# Patient Record
Sex: Female | Born: 1960 | Race: White | Hispanic: No | Smoking: Never smoker
Health system: Southern US, Community
[De-identification: ages and names within clinical notes are randomized; demographics above are authoritative.]

## PROBLEM LIST (undated history)

## (undated) DIAGNOSIS — N390 Urinary tract infection, site not specified: Secondary | ICD-10-CM

## (undated) DIAGNOSIS — T4145XA Adverse effect of unspecified anesthetic, initial encounter: Secondary | ICD-10-CM

## (undated) DIAGNOSIS — N8110 Cystocele, unspecified: Secondary | ICD-10-CM

## (undated) DIAGNOSIS — T8859XA Other complications of anesthesia, initial encounter: Secondary | ICD-10-CM

## (undated) DIAGNOSIS — K219 Gastro-esophageal reflux disease without esophagitis: Secondary | ICD-10-CM

## (undated) DIAGNOSIS — M5430 Sciatica, unspecified side: Secondary | ICD-10-CM

## (undated) DIAGNOSIS — J45909 Unspecified asthma, uncomplicated: Secondary | ICD-10-CM

## (undated) DIAGNOSIS — Z1371 Encounter for nonprocreative screening for genetic disease carrier status: Secondary | ICD-10-CM

## (undated) HISTORY — DX: Unspecified asthma, uncomplicated: J45.909

## (undated) HISTORY — DX: Gastro-esophageal reflux disease without esophagitis: K21.9

---
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
## 1993-01-16 HISTORY — PX: NASAL SINUS SURGERY: SHX719

## 2004-12-21 ENCOUNTER — Ambulatory Visit: Payer: Self-pay | Admitting: Obstetrics and Gynecology

## 2006-08-07 ENCOUNTER — Ambulatory Visit: Payer: Self-pay | Admitting: Obstetrics and Gynecology

## 2007-08-20 ENCOUNTER — Ambulatory Visit: Payer: Self-pay | Admitting: Obstetrics and Gynecology

## 2008-09-24 ENCOUNTER — Ambulatory Visit: Payer: Self-pay | Admitting: Obstetrics and Gynecology

## 2009-10-05 ENCOUNTER — Ambulatory Visit: Payer: Self-pay | Admitting: Obstetrics and Gynecology

## 2011-01-24 ENCOUNTER — Ambulatory Visit: Payer: Self-pay | Admitting: Obstetrics and Gynecology

## 2012-01-01 ENCOUNTER — Ambulatory Visit: Payer: Self-pay | Admitting: Gastroenterology

## 2012-01-25 ENCOUNTER — Ambulatory Visit: Payer: Self-pay | Admitting: Obstetrics and Gynecology

## 2013-06-17 ENCOUNTER — Ambulatory Visit: Payer: Self-pay | Admitting: Obstetrics and Gynecology

## 2013-07-01 ENCOUNTER — Ambulatory Visit: Payer: Self-pay | Admitting: Obstetrics and Gynecology

## 2013-09-22 ENCOUNTER — Inpatient Hospital Stay: Payer: Self-pay | Admitting: Surgery

## 2013-09-22 LAB — URINALYSIS, COMPLETE
BLOOD: NEGATIVE
Bacteria: NONE SEEN
Bilirubin,UR: NEGATIVE
Glucose,UR: NEGATIVE mg/dL (ref 0–75)
KETONE: NEGATIVE
LEUKOCYTE ESTERASE: NEGATIVE
Nitrite: NEGATIVE
PH: 6 (ref 4.5–8.0)
Protein: NEGATIVE
SPECIFIC GRAVITY: 1.009 (ref 1.003–1.030)
Squamous Epithelial: NONE SEEN
WBC UR: NONE SEEN /HPF (ref 0–5)

## 2013-09-22 LAB — CBC WITH DIFFERENTIAL/PLATELET
Basophil #: 0.1 10*3/uL (ref 0.0–0.1)
Basophil %: 0.7 %
Eosinophil #: 0 10*3/uL (ref 0.0–0.7)
Eosinophil %: 0.5 %
HCT: 40.3 % (ref 35.0–47.0)
HGB: 13.8 g/dL (ref 12.0–16.0)
LYMPHS PCT: 22.9 %
Lymphocyte #: 1.9 10*3/uL (ref 1.0–3.6)
MCH: 30.2 pg (ref 26.0–34.0)
MCHC: 34.2 g/dL (ref 32.0–36.0)
MCV: 88 fL (ref 80–100)
MONO ABS: 0.5 x10 3/mm (ref 0.2–0.9)
MONOS PCT: 6.3 %
NEUTROS ABS: 5.8 10*3/uL (ref 1.4–6.5)
NEUTROS PCT: 69.6 %
Platelet: 235 10*3/uL (ref 150–440)
RBC: 4.56 10*6/uL (ref 3.80–5.20)
RDW: 13.4 % (ref 11.5–14.5)
WBC: 8.3 10*3/uL (ref 3.6–11.0)

## 2013-09-22 LAB — COMPREHENSIVE METABOLIC PANEL
ALBUMIN: 4 g/dL (ref 3.4–5.0)
ANION GAP: 8 (ref 7–16)
Alkaline Phosphatase: 134 U/L — ABNORMAL HIGH
BUN: 13 mg/dL (ref 7–18)
Bilirubin,Total: 1.9 mg/dL — ABNORMAL HIGH (ref 0.2–1.0)
CALCIUM: 10.1 mg/dL (ref 8.5–10.1)
CHLORIDE: 105 mmol/L (ref 98–107)
Co2: 26 mmol/L (ref 21–32)
Creatinine: 0.78 mg/dL (ref 0.60–1.30)
EGFR (African American): >60
EGFR (Non-African Amer.): >60
Glucose: 157 mg/dL — ABNORMAL HIGH (ref 65–99)
Osmolality: 281 (ref 275–301)
POTASSIUM: 4.3 mmol/L (ref 3.5–5.1)
SGOT(AST): 468 U/L — ABNORMAL HIGH (ref 15–37)
SGPT (ALT): 307 U/L — ABNORMAL HIGH
Sodium: 139 mmol/L (ref 136–145)
Total Protein: 7.7 g/dL (ref 6.4–8.2)

## 2013-09-22 LAB — LIPASE, BLOOD: Lipase: 231 U/L (ref 73–393)

## 2013-09-22 LAB — TROPONIN I: Troponin-I: <0.02 ng/mL

## 2013-09-23 LAB — CBC WITH DIFFERENTIAL/PLATELET
BASOS ABS: 0 10*3/uL (ref 0.0–0.1)
Basophil %: 0.8 %
EOS PCT: 3.7 %
Eosinophil #: 0.2 10*3/uL (ref 0.0–0.7)
HCT: 38.8 % (ref 35.0–47.0)
HGB: 12.8 g/dL (ref 12.0–16.0)
Lymphocyte #: 1.6 10*3/uL (ref 1.0–3.6)
Lymphocyte %: 34.1 %
MCH: 29.8 pg (ref 26.0–34.0)
MCHC: 33 g/dL (ref 32.0–36.0)
MCV: 91 fL (ref 80–100)
MONOS PCT: 8.6 %
Monocyte #: 0.4 x10 3/mm (ref 0.2–0.9)
NEUTROS ABS: 2.6 10*3/uL (ref 1.4–6.5)
NEUTROS PCT: 52.8 %
Platelet: 208 10*3/uL (ref 150–440)
RBC: 4.29 10*6/uL (ref 3.80–5.20)
RDW: 13.9 % (ref 11.5–14.5)
WBC: 4.8 10*3/uL (ref 3.6–11.0)

## 2013-09-23 LAB — COMPREHENSIVE METABOLIC PANEL
ALK PHOS: 134 U/L — AB
ALT: 256 U/L — AB
AST: 149 U/L — AB (ref 15–37)
Albumin: 3.6 g/dL (ref 3.4–5.0)
Anion Gap: 4 — ABNORMAL LOW (ref 7–16)
BUN: 9 mg/dL (ref 7–18)
Bilirubin,Total: 2 mg/dL — ABNORMAL HIGH (ref 0.2–1.0)
CALCIUM: 9.2 mg/dL (ref 8.5–10.1)
CHLORIDE: 105 mmol/L (ref 98–107)
CREATININE: 0.89 mg/dL (ref 0.60–1.30)
Co2: 31 mmol/L (ref 21–32)
EGFR (Non-African Amer.): >60
Glucose: 104 mg/dL — ABNORMAL HIGH (ref 65–99)
OSMOLALITY: 278 (ref 275–301)
Potassium: 3.9 mmol/L (ref 3.5–5.1)
Sodium: 140 mmol/L (ref 136–145)
Total Protein: 6.8 g/dL (ref 6.4–8.2)

## 2013-09-23 LAB — HCG, QUANTITATIVE, PREGNANCY

## 2013-09-24 LAB — CBC WITH DIFFERENTIAL/PLATELET
BASOS ABS: 0 10*3/uL (ref 0.0–0.1)
Basophil %: 0.1 %
Eosinophil #: 0 10*3/uL (ref 0.0–0.7)
Eosinophil %: 0 %
HCT: 35.7 % (ref 35.0–47.0)
HGB: 12.2 g/dL (ref 12.0–16.0)
LYMPHS ABS: 1.1 10*3/uL (ref 1.0–3.6)
LYMPHS PCT: 10.9 %
MCH: 30.1 pg (ref 26.0–34.0)
MCHC: 34.1 g/dL (ref 32.0–36.0)
MCV: 88 fL (ref 80–100)
MONO ABS: 0.6 x10 3/mm (ref 0.2–0.9)
Monocyte %: 6.5 %
NEUTROS PCT: 82.5 %
Neutrophil #: 8.2 10*3/uL — ABNORMAL HIGH (ref 1.4–6.5)
Platelet: 221 10*3/uL (ref 150–440)
RBC: 4.04 10*6/uL (ref 3.80–5.20)
RDW: 13.7 % (ref 11.5–14.5)
WBC: 10 10*3/uL (ref 3.6–11.0)

## 2013-09-24 LAB — COMPREHENSIVE METABOLIC PANEL
ALBUMIN: 3.6 g/dL (ref 3.4–5.0)
AST: 69 U/L — AB (ref 15–37)
Alkaline Phosphatase: 115 U/L
Anion Gap: 8 (ref 7–16)
BUN: 10 mg/dL (ref 7–18)
Bilirubin,Total: 1.3 mg/dL — ABNORMAL HIGH (ref 0.2–1.0)
CALCIUM: 9.3 mg/dL (ref 8.5–10.1)
CO2: 28 mmol/L (ref 21–32)
Chloride: 100 mmol/L (ref 98–107)
Creatinine: 0.81 mg/dL (ref 0.60–1.30)
Glucose: 141 mg/dL — ABNORMAL HIGH (ref 65–99)
OSMOLALITY: 273 (ref 275–301)
Potassium: 3.9 mmol/L (ref 3.5–5.1)
SGPT (ALT): 179 U/L — ABNORMAL HIGH
Sodium: 136 mmol/L (ref 136–145)
TOTAL PROTEIN: 6.9 g/dL (ref 6.4–8.2)

## 2013-09-26 LAB — PATHOLOGY REPORT

## 2014-01-16 DIAGNOSIS — Z1371 Encounter for nonprocreative screening for genetic disease carrier status: Secondary | ICD-10-CM

## 2014-01-16 HISTORY — DX: Encounter for nonprocreative screening for genetic disease carrier status: Z13.71

## 2014-01-16 HISTORY — PX: CHOLECYSTECTOMY: SHX55

## 2014-01-16 HISTORY — PX: AUGMENTATION MAMMAPLASTY: SUR837

## 2014-05-09 NOTE — H&P (Signed)
   Subjective/Chief Complaint Epigastric pain x 12 hours   History of Present Illness Teresa Mueller is a pleasant, relatively healthy 54 yo F who presents with approx 12 hours of epigastric pain, now resolved, elevated LFT and gallstones.  Pain began acutely approx 4 pm.  Made herself vomit without resolution.  H/o on and off pain past 3 years.  Was fine prior to this.  No sick contacts, no unusual ingestions.  Nl BM yesterday.   Past History H/o right inguinal hernia repair as a child Diabetes - borderline GERD H/o rhinoplasty   Past Med/Surgical Hx:  Diabetes - Borderline:   GERD - Esophageal Reflux:   Hernia Repair:   Rhinoplasty:   ALLERGIES:  No Known Allergies:   Family and Social History:  Family History Diabetes Mellitus  Cancer  Liver, breast cancer   Social History negative tobacco, positive ETOH, Social EtOH   Place of Living Home   Review of Systems:  Subjective/Chief Complaint Epigastric pain   Fever/Chills No   Cough No   Sputum No   Abdominal Pain Yes   Diarrhea No   Constipation No   Nausea/Vomiting No   Chest Pain No   Dysuria No   Tolerating Diet Yes   Physical Exam:  GEN well developed, well nourished, no acute distress   HEENT pink conjunctivae, PERRL, hearing intact to voice, good dentition   RESP normal resp effort  clear BS  no use of accessory muscles   CARD regular rate  no murmur  no thrills  No LE edema  no JVD   ABD denies tenderness  denies Flank Tenderness  no hernia  soft  normal BS  no Abdominal Bruits  no Adominal Mass   SKIN normal to palpation, No rashes, No ulcers, skin turgor good   NEURO cranial nerves intact, negative rigidity, negative tremor, follows commands, strength:, motor/sensory function intact   PSYCH A+O to time, place, person, good insight    Assessment/Admission Diagnosis Teresa Mueller is a pleasant 54 yo F who presents with 12 hours of epigastric pain, elevated LFT, gallstones.  Concern for  choledocholithiasis, possibly has passed stone as no longer in pain.   Plan Will admit for IVF, abx, GI consult.  LFT in am.  Recommend cholecystectomy when duct cleared of stones (via MRCP or improved LFT.   Electronic Signatures: Jarvis NewcomerLundquist, Keatyn Jawad A (MD)  (Signed 07-Sep-15 05:47)  Authored: CHIEF COMPLAINT and HISTORY, PAST MEDICAL/SURGIAL HISTORY, ALLERGIES, FAMILY AND SOCIAL HISTORY, REVIEW OF SYSTEMS, PHYSICAL EXAM, ASSESSMENT AND PLAN   Last Updated: 07-Sep-15 05:47 by Jarvis NewcomerLundquist, Shandrea Lusk A (MD)

## 2014-05-09 NOTE — Discharge Summary (Signed)
PATIENT NAME:  Teresa Mueller, Teresa Mueller MR#:  [id number] DATE OF BIRTH:  Sep 12, 1960  DATE OF ADMISSION:  09/22/2013 DATE OF DISCHARGE:  09/24/2013  DIAGNOSES: Borderline diabetes, reflux disease, choledocholithiasis with cholelithiasis.   PROCEDURE: Laparoscopic cholecystectomy with cholangiography.   HISTORY OF PRESENT ILLNESS: This is a patient with a few hours of abdominal pain in the epigastrium and right upper quadrant and a workup suggesting choledocholithiasis with elevated liver function tests. She was admitted to the hospital, started on IV antibiotics and hydrated. Pain and nausea control was good and her liver function tests began to fall. Her pain resolved. Therefore, she was taken to the operating room with the belief that her gallstone had passed out of the bile duct. This was confirmed on cholangiography with good flow in the duodenum. No intraluminal filling defects. Cholecystectomy was performed laparoscopically and she made an uncomplicated postoperative recovery, to follow up in our office in 10 days, tolerating a regular diet with oral analgesics at home and wound care instructions.    ____________________________ Adah Salvageichard E. Excell Seltzerooper, MD rec:TT D: 09/24/2013 14:46:50 ET T: 09/24/2013 16:54:29 ET JOB#: [id number]  cc: Adah Salvageichard E. Excell Seltzerooper, MD, <Dictator> Lattie HawICHARD E Astrid Vides MD ELECTRONICALLY SIGNED 09/24/2013 18:21

## 2014-05-09 NOTE — Op Note (Signed)
PATIENT NAME:  Teresa Mueller, Teresa Mueller MR#:  [id number] DATE OF BIRTH:  [birth year]  DATE OF PROCEDURE:  09/23/2013  PREOPERATIVE DIAGNOSIS: Choledocholithiasis.   POSTOPERATIVE DIAGNOSIS: Choledocholithiasis.   PROCEDURE: Laparoscopic cholecystectomy with C-arm fluoroscopic cholangiography.   SURGEON: Khylee Algeo E. Excell Seltzerooper, MD   ANESTHESIA: General with endotracheal tube.   ASSISTANT: Larina BrasJen Beard, PA-S   INDICATIONS: This is a patient with signs of choledocholithiasis, with elevated liver function tests and known gallstones. Her pain has resolved, but her LFTs have been increased. We discussed the rationale for offering surgery, the options of observation, risk of bleeding, infection, recurrence of symptoms, failure to resolve her symptoms, open procedure, bile duct damage, bile duct leak, retained common bile duct stone, any of which could require further surgery and/or ERCP, stent and papillotomy. This was all reviewed for her. She understood and agreed to proceed.   FINDINGS: Multiple adhesions of the duodenum to the infundibulum of the gallbladder, signifying prior inflammatory disease. Also, C-arm fluoroscopic cholangiography demonstrated good flow into the duodenum without intraluminal filling defects. The cystic duct had been cannulated, and the proximal ducts were well identified.   DESCRIPTION OF PROCEDURE: The patient was induced to general anesthesia. She was on IV antibiotics. VTE prophylaxis was in place. She was prepped and draped in sterile fashion. Marcaine was infiltrated in the skin and subcutaneous tissues around the periumbilical area, avoiding the prior piercing site. A Veress needle was placed. Pneumoperitoneum was obtained, and a 5 mm trocar port was placed. The abdominal cavity was explored, and under direct vision, a midline 10 mm port was placed in the epigastric area, and 2 lateral 5 mm ports were placed on the right side. The gallbladder was placed on tension. Adhesions were taken  down bluntly without the use of energy. The peritoneum over the infundibulum was incised bluntly. The cystic duct-gallbladder junction was well identified, clipped and incised, and through a separate incision, an Angiocath cholangiogram catheter was placed. C-arm fluoroscopic cholangiography demonstrated the above. The cholangiogram catheter was removed. The cystic duct was doubly clipped and divided, and then the cystic artery was well identified, doubly clipped and divided, and the gallbladder was taken from the gallbladder fossa with electrocautery and passed out through the epigastric port site with the aid of an Endo Catch bag. The area was checked for hemostasis and found to be adequate. There was no sign of bleeding, bile leak or bowel injury. The camera was placed in the epigastric site to view back to the periumbilical site. There was no sign of adhesions or bowel injury. Therefore, pneumoperitoneum was released. All ports were removed. Fascial edges at the epigastric site were approximated with figure-of-eight 0 Vicryls, and 4-0 subcuticular Monocryl was used on all skin edges. Steri-Strips, Mastisol and sterile dressings were placed.   The patient tolerated the procedure well. There were no complications. She was taken to the recovery room in stable condition to be admitted for continued care.    ____________________________ Adah Salvageichard E. Excell Seltzerooper, MD rec:lb D: 09/23/2013 12:07:05 ET T: 09/23/2013 12:37:27 ET JOB#: [id number]  cc: Adah Salvageichard E. Excell Seltzerooper, MD, <Dictator> Lattie HawICHARD E Faron Whitelock MD ELECTRONICALLY SIGNED 09/23/2013 18:31

## 2014-07-16 ENCOUNTER — Ambulatory Visit
Admission: RE | Admit: 2014-07-16 | Discharge: 2014-07-16 | Disposition: A | Discharge status: Home / Self Care | Payer: BC Managed Care – PPO | Source: Ambulatory Visit | Attending: Obstetrics and Gynecology | Admitting: Obstetrics and Gynecology

## 2014-07-16 ENCOUNTER — Other Ambulatory Visit: Payer: Self-pay | Admitting: Obstetrics and Gynecology

## 2014-07-16 DIAGNOSIS — Z1231 Encounter for screening mammogram for malignant neoplasm of breast: Secondary | ICD-10-CM

## 2014-07-22 ENCOUNTER — Other Ambulatory Visit (HOSPITAL_COMMUNITY)
Admission: RE | Admit: 2014-07-22 | Discharge: 2014-07-22 | Disposition: A | Discharge status: Home / Self Care | Payer: BC Managed Care – PPO | Source: Ambulatory Visit | Attending: Plastic Surgery | Admitting: Plastic Surgery

## 2014-07-22 DIAGNOSIS — D509 Iron deficiency anemia, unspecified: Secondary | ICD-10-CM | POA: Diagnosis present

## 2014-07-22 LAB — CBC
HCT: 42 % (ref 36.0–46.0)
HEMOGLOBIN: 13.9 g/dL (ref 12.0–15.0)
MCH: 30 pg (ref 26.0–34.0)
MCHC: 33.1 g/dL (ref 30.0–36.0)
MCV: 90.7 fL (ref 78.0–100.0)
Platelets: 231 10*3/uL (ref 150–400)
RBC: 4.63 MIL/uL (ref 3.87–5.11)
RDW: 13.6 % (ref 11.5–15.5)
WBC: 7.7 10*3/uL (ref 4.0–10.5)

## 2015-08-16 ENCOUNTER — Other Ambulatory Visit: Payer: Self-pay | Admitting: Obstetrics and Gynecology

## 2015-08-16 DIAGNOSIS — Z1231 Encounter for screening mammogram for malignant neoplasm of breast: Secondary | ICD-10-CM

## 2015-08-17 ENCOUNTER — Ambulatory Visit
Admission: RE | Admit: 2015-08-17 | Discharge: 2015-08-17 | Disposition: A | Discharge status: Home / Self Care | Payer: BC Managed Care – PPO | Source: Ambulatory Visit | Attending: Obstetrics and Gynecology | Admitting: Obstetrics and Gynecology

## 2015-08-17 ENCOUNTER — Other Ambulatory Visit: Payer: Self-pay | Admitting: Obstetrics and Gynecology

## 2015-08-17 DIAGNOSIS — Z1231 Encounter for screening mammogram for malignant neoplasm of breast: Secondary | ICD-10-CM | POA: Diagnosis present

## 2015-08-17 HISTORY — DX: Encounter for nonprocreative screening for genetic disease carrier status: Z13.71

## 2015-12-31 IMAGING — MG MM DIGITAL SCREENING BILAT W/ CAD
1 series · 5 of 5 positions shown · non-contrast
Comparison: Previous exam(s).

CLINICAL DATA: Screening.

EXAM:
DIGITAL SCREENING BILATERAL MAMMOGRAM WITH CAD

[R CC · right · 5 of 5 slices shown]
[im 1/5]
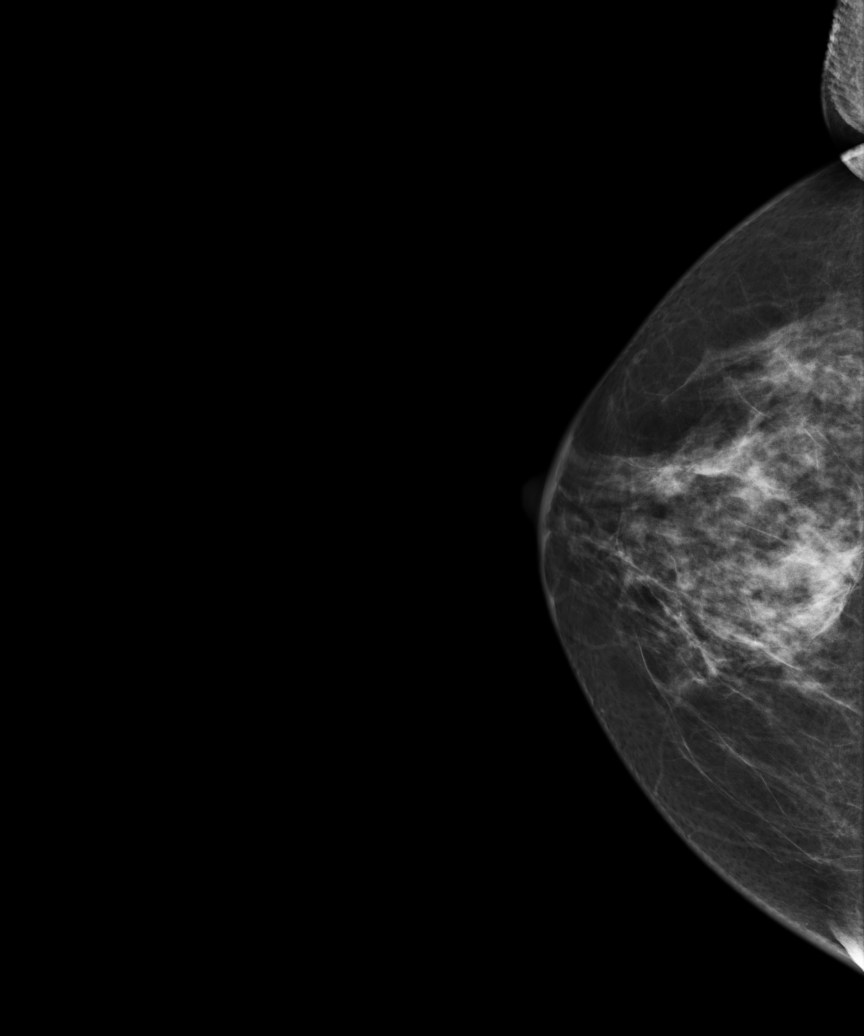
[im 2/5]
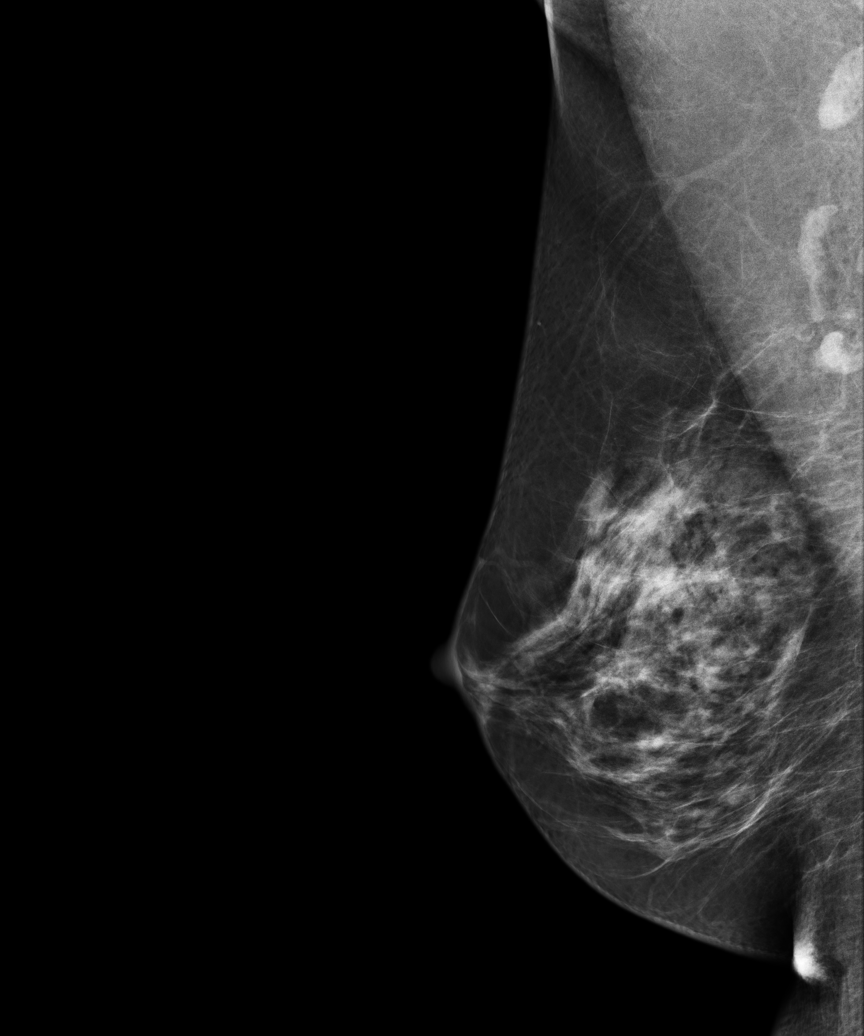
[im 3/5]
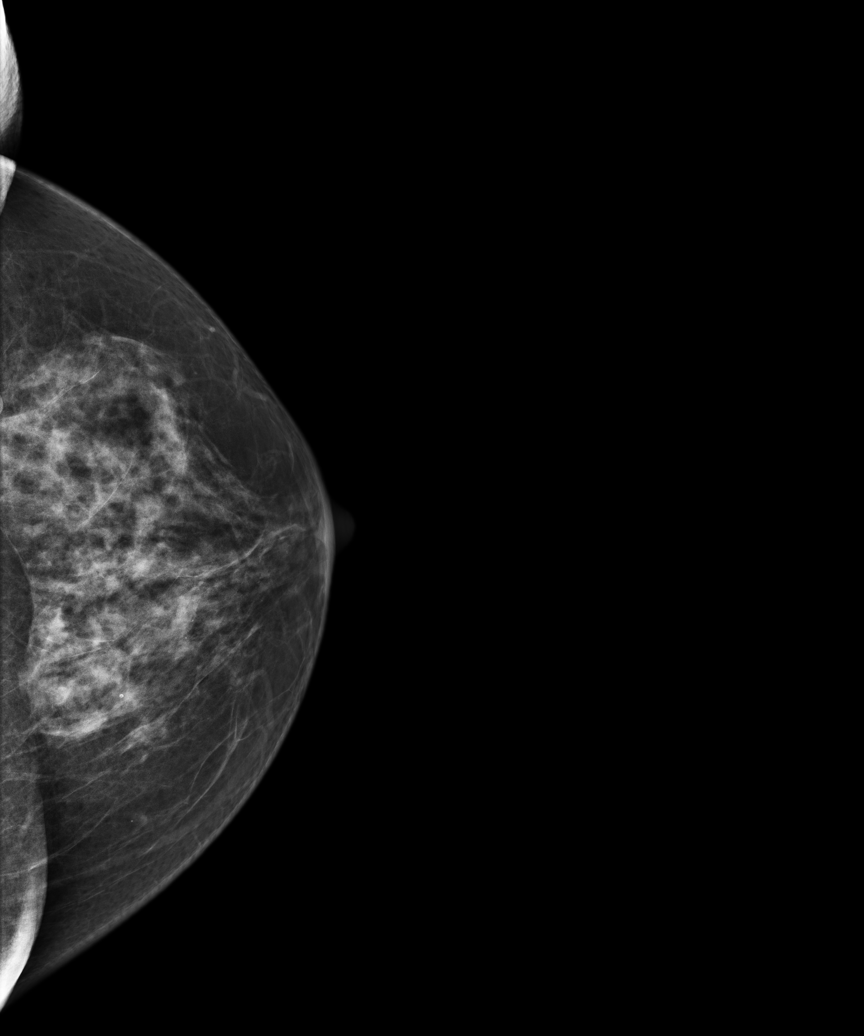
[im 4/5]
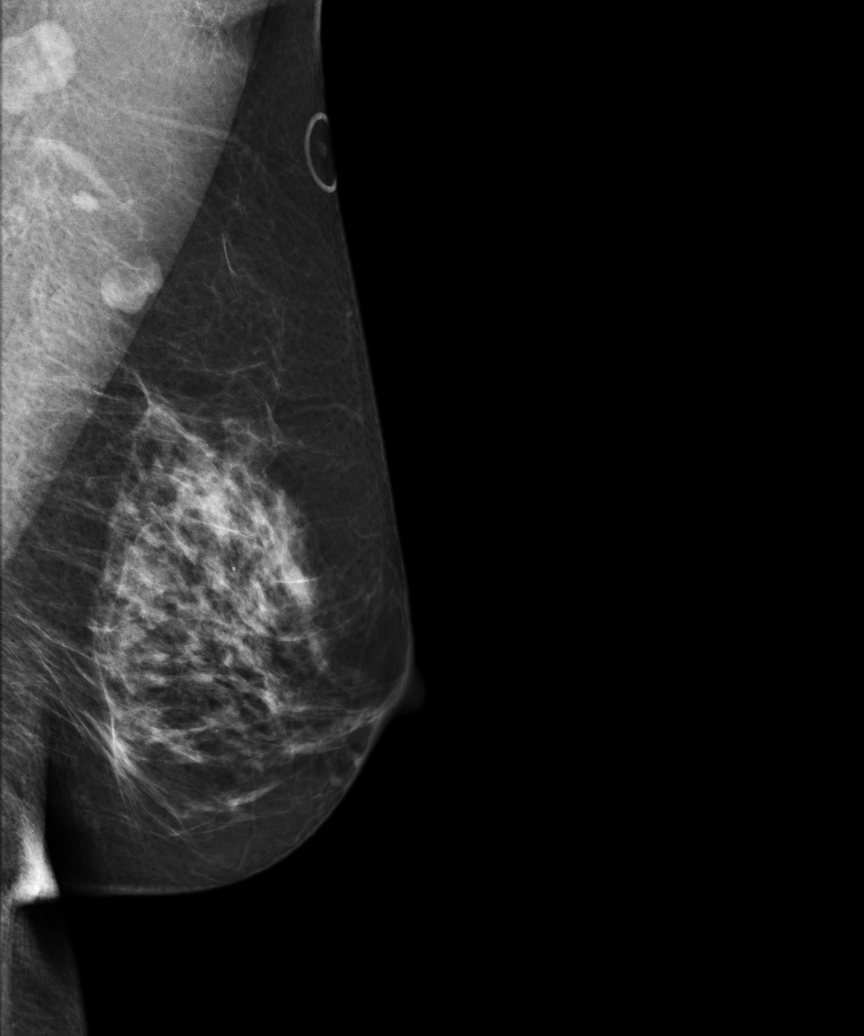
[im 5/5]
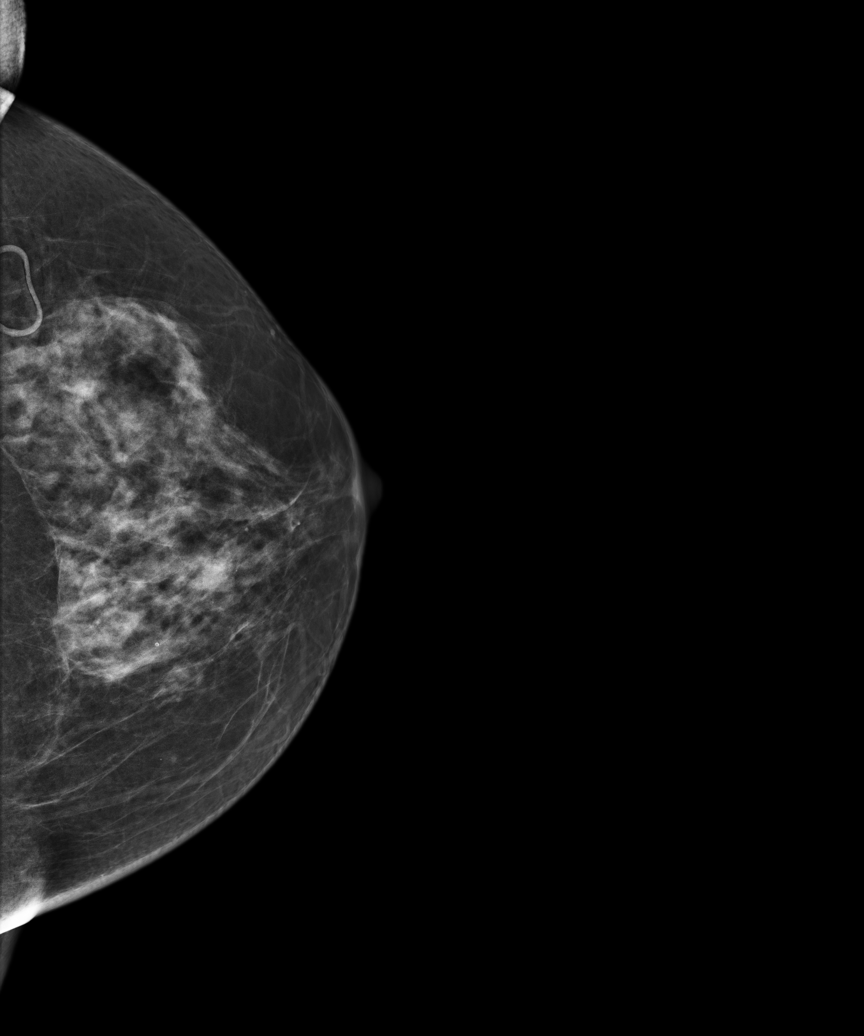

[5 of 5 positions shown; findings below may reference images not displayed]

ACR Breast Density Category c: The breast tissue is heterogeneously
dense, which may obscure small masses.
FINDINGS: There are no findings suspicious for malignancy. Images were
processed with CAD.
IMPRESSION: No mammographic evidence of malignancy. A result letter of this
screening mammogram will be mailed directly to the patient.

RECOMMENDATION:
Screening mammogram in one year. (Code:YJ-[id number])

BI-RADS CATEGORY  1: Negative.

## 2016-02-23 ENCOUNTER — Ambulatory Visit: Payer: Self-pay | Admitting: Urology

## 2016-02-28 ENCOUNTER — Encounter: Payer: Self-pay | Admitting: Urology

## 2016-02-28 ENCOUNTER — Ambulatory Visit: Payer: BC Managed Care – PPO | Admitting: Urology

## 2016-02-28 VITALS — BP 116/80 | HR 97 | Ht 65.0 in | Wt 159.0 lb

## 2016-02-28 DIAGNOSIS — R3129 Other microscopic hematuria: Secondary | ICD-10-CM | POA: Diagnosis not present

## 2016-02-28 DIAGNOSIS — N3941 Urge incontinence: Secondary | ICD-10-CM | POA: Diagnosis not present

## 2016-02-28 DIAGNOSIS — R300 Dysuria: Secondary | ICD-10-CM | POA: Diagnosis not present

## 2016-02-28 DIAGNOSIS — N390 Urinary tract infection, site not specified: Secondary | ICD-10-CM | POA: Diagnosis not present

## 2016-02-28 DIAGNOSIS — J4520 Mild intermittent asthma, uncomplicated: Secondary | ICD-10-CM | POA: Insufficient documentation

## 2016-02-28 DIAGNOSIS — M542 Cervicalgia: Secondary | ICD-10-CM | POA: Insufficient documentation

## 2016-02-28 DIAGNOSIS — K219 Gastro-esophageal reflux disease without esophagitis: Secondary | ICD-10-CM | POA: Insufficient documentation

## 2016-02-28 LAB — BLADDER SCAN AMB NON-IMAGING

## 2016-02-28 NOTE — Progress Notes (Signed)
02/28/2016 4:46 PM   Teresa Mueller 02-15-1960 [id number]  Referring provider: Sherrin Daisy, MD [place], [place] [address]  Chief Complaint  Patient presents with  . Recurrent UTI    New Patient    HPI: Patient is a 56 year old Caucasian female who is referred to [place] by Dr. Kandice Robinsons for recurrent urinary tract infections.  Patient states that she has had urinary tract infections twice over the last year since last fall.    Her symptoms with a urinary tract infection consist of urgency, hands tingling, weak urinary stream and dysuria.    She denies gross hematuria, suprapubic pain, back pain, abdominal pain or flank pain.  She has not had any recent fevers, chills, nausea or vomiting.   She does not have a history of nephrolithiasis, GU surgery or GU trauma.   Reviewing her records,  she has had one documented infection for pan sensitive E.coli in 2016.    She is sexually active.  She has not noted a correlation with her urinary tract infections and sexual intercourse.  She is post menopausal.  She does not engage in anal sex.   She is voiding before and after sex.   She denies constipation and/or diarrhea.  She does engage in good perineal hygiene. She does not take tub baths.   She does have incontinence with urgency.  She does not lose urine when she laughs coughs or sneezes.  She is not using incontinence pads.  She is taking Vesicare 10 mg daily.    She is not having pain with bladder filling.    She has not had any recent imaging studies.    She is drinking two full bottles of water daily.  She has one cup of coffee during the day.  She has been drinking cranberry juice for the last couple of months.  Her PVR was 78 mL.    Patient has had instances of AMH with 0-3 RBC's on a few occasions with negative urine cultures.  PMH: Past Medical History:  Diagnosis Date  . Asthma   . BRCA negative 2016  . Diabetes ([place])   . GERD (gastroesophageal reflux  disease)     Surgical History: Past Surgical History:  Procedure Laterality Date  . AUGMENTATION MAMMAPLASTY Bilateral 2016   SALINE - RETROPECTORAL  . CHOLECYSTECTOMY  2016  . HERNIA REPAIR  1970  . NASAL SINUS SURGERY  1995    Home Medications:  Allergies as of 02/28/2016   No Known Allergies     Medication List       Accurate as of 02/28/16  4:46 PM. Always use your most recent med list.          omeprazole 20 MG capsule Commonly known as:  PRILOSEC TAKE ONE (1) CAPSULE EACH DAY   solifenacin 10 MG tablet Commonly known as:  VESICARE Take by mouth.       Allergies: No Known Allergies  Family History: Family History  Problem Relation Age of Onset  . Breast cancer Mother 60  . Breast cancer Sister 52  . Breast cancer Maternal Aunt   . Bladder Cancer Father     Social History:  reports that she has never smoked. She has never used smokeless tobacco. She reports that she drinks alcohol. She reports that she does not use drugs.  ROS: UROLOGY Frequent Urination?: Yes Hard to postpone urination?: Yes Burning/pain with urination?: Yes Get up at night to urinate?: Yes Leakage of urine?: Yes  Urine stream starts and stops?: Yes Trouble starting stream?: No Do you have to strain to urinate?: No Blood in urine?: No Urinary tract infection?: Yes Sexually transmitted disease?: No Injury to kidneys or bladder?: No Painful intercourse?: No Weak stream?: No Currently pregnant?: No Vaginal bleeding?: No Last menstrual period?: 2016  Gastrointestinal Nausea?: No Vomiting?: No Indigestion/heartburn?: No Diarrhea?: No Constipation?: No  Constitutional Fever: No Night sweats?: No Weight loss?: No Fatigue?: No  Skin Skin rash/lesions?: No Itching?: No  Eyes Blurred vision?: No Double vision?: No  Ears/Nose/Throat Sore throat?: No Sinus problems?: No  Hematologic/Lymphatic Swollen glands?: No Easy bruising?: No  Cardiovascular Leg swelling?:  No Chest pain?: No  Respiratory Cough?: No Shortness of breath?: No  Endocrine Excessive thirst?: No  Musculoskeletal Back pain?: No Joint pain?: Yes  Neurological Headaches?: No Dizziness?: No  Psychologic Depression?: No Anxiety?: No  Physical Exam: BP 116/80   Pulse 97   Ht '5\' 5"'  (1.651 m)   Wt 159 lb (72.1 kg)   BMI 26.46 kg/m   Constitutional: Well nourished. Alert and oriented, No acute distress. HEENT: [place] AT, moist mucus membranes. Trachea midline, no masses. Cardiovascular: No clubbing, cyanosis, or edema. Respiratory: Normal respiratory effort, no increased work of breathing. GI: Abdomen is soft, non tender, non distended, no abdominal masses. Liver and spleen not palpable.  No hernias appreciated.  Stool sample for occult testing is not indicated.   GU: No CVA tenderness.  No bladder fullness or masses.  Normal external genitalia, normal pubic hair distribution, no lesions.  Normal urethral meatus, no lesions, no prolapse, no discharge.   No urethral masses, tenderness and/or tenderness. No bladder fullness, tenderness or masses. Normal vagina mucosa, good estrogen effect, no discharge, no lesions, good pelvic support, Grade III cystocele is noted. No rectocele noted.  No cervical motion tenderness.  Uterus is freely mobile and non-fixed.  No adnexal/parametria masses or tenderness noted.  Anus and perineum are without rashes or lesions.    Skin: No rashes, bruises or suspicious lesions. Lymph: No cervical or inguinal adenopathy. Neurologic: Grossly intact, no focal deficits, moving all 4 extremities. Psychiatric: Normal mood and affect.  Laboratory Data: Lab Results  Component Value Date   WBC 7.7 07/22/2014   HGB 13.9 07/22/2014   HCT 42.0 07/22/2014   MCV 90.7 07/22/2014   PLT 231 07/22/2014    Lab Results  Component Value Date   CREATININE 0.81 09/24/2013     Lab Results  Component Value Date   AST 69 (H) 09/24/2013   Lab Results  Component  Value Date   ALT 179 (H) 09/24/2013     Pertinent Imaging: Results for BRIDGITTE, FELICETTI (MRN [id number]) as of 03/05/2016 21:05  Ref. Range 02/28/2016 15:59  Scan Result Unknown 46m    Assessment & Plan:   1. Microscopic hematuria  - I explained to the patient that there are a number of causes that can be associated with blood in the urine, such as stones, UTI's, damage to the urinary tract and/or cancer.  - At this time, I felt that the patient warranted further urologic evaluation.   The AUA guidelines state that a CT urogram is the preferred imaging study to evaluate hematuria.  - I explained to the patient that a contrast material will be injected into a vein and that in rare instances, an allergic reaction can result and may even life threatening   The patient denies any allergies to contrast, iodine and/or seafood and is not taking metformin.  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 Urge incontinence  - patient currently on Vesicare 10 mg daily  - still experiencing urgency and frequency  - will reassess if CT Urogram does not find an etiology - stone, etc.  3. Dysuria  - occurs with negative cultures  - will reassess if CT Urogram does not find an etiology - stone, etc.   Return for I will call patient with results.  These notes generated with voice recognition software. I apologize for typographical errors.  Zara Council, [facility] [address] [place],  [address] [phone]

## 2016-02-29 LAB — BUN+CREAT
BUN / CREAT RATIO: 27 — AB (ref 9–23)
BUN: 19 mg/dL (ref 6–24)
Creatinine, Ser: 0.7 mg/dL (ref 0.57–1.00)
GFR calc non Af Amer: 97 mL/min/{1.73_m2} (ref >59)
GFR, EST AFRICAN AMERICAN: 112 mL/min/{1.73_m2} (ref >59)

## 2016-03-09 ENCOUNTER — Ambulatory Visit
Admission: RE | Admit: 2016-03-09 | Discharge: 2016-03-09 | Disposition: A | Discharge status: Home / Self Care | Payer: BC Managed Care – PPO | Source: Ambulatory Visit | Attending: Urology | Admitting: Urology

## 2016-03-09 DIAGNOSIS — N8189 Other female genital prolapse: Secondary | ICD-10-CM | POA: Diagnosis not present

## 2016-03-09 DIAGNOSIS — N8110 Cystocele, unspecified: Secondary | ICD-10-CM | POA: Diagnosis not present

## 2016-03-09 DIAGNOSIS — K449 Diaphragmatic hernia without obstruction or gangrene: Secondary | ICD-10-CM | POA: Diagnosis not present

## 2016-03-09 DIAGNOSIS — R3129 Other microscopic hematuria: Secondary | ICD-10-CM | POA: Diagnosis present

## 2016-03-09 DIAGNOSIS — R918 Other nonspecific abnormal finding of lung field: Secondary | ICD-10-CM | POA: Diagnosis not present

## 2016-03-09 MED ORDER — IOPAMIDOL (ISOVUE-300) INJECTION 61%
125.0000 mL | Freq: Once | INTRAVENOUS | Status: AC | PRN
  Administered 2016-03-09: 125 mL via INTRAVENOUS

## 2016-03-09 MED ORDER — IOPAMIDOL (ISOVUE-300) INJECTION 61%: 100.0000 mL | Freq: Once | INTRAVENOUS | Status: DC | PRN

## 2016-03-13 ENCOUNTER — Telehealth: Payer: Self-pay

## 2016-03-13 NOTE — Telephone Encounter (Signed)
-----   Message from Harle BattiestShannon A McGowan, PA-C sent at 03/11/2016  6:29 PM EST ----- Please notify the patient that her CT scan did not find any kidney stones.  I do suggest she undergo a cystoscopy to evaluate the inside of her bladder.

## 2016-03-13 NOTE — Telephone Encounter (Signed)
LMOM

## 2016-03-14 NOTE — Telephone Encounter (Signed)
Spoke with pt in reference to CT results and needing a cysto. Pt voiced understanding. Pt was transferred to the front to make cysto appt.

## 2016-03-14 NOTE — Telephone Encounter (Signed)
LMOM

## 2016-03-21 ENCOUNTER — Ambulatory Visit: Payer: BC Managed Care – PPO | Admitting: Urology

## 2016-03-21 ENCOUNTER — Encounter: Payer: Self-pay | Admitting: Urology

## 2016-03-21 VITALS — BP 114/73 | HR 96 | Ht 65.0 in | Wt 157.0 lb

## 2016-03-21 DIAGNOSIS — R3129 Other microscopic hematuria: Secondary | ICD-10-CM | POA: Diagnosis not present

## 2016-03-21 DIAGNOSIS — N8111 Cystocele, midline: Secondary | ICD-10-CM

## 2016-03-21 DIAGNOSIS — R911 Solitary pulmonary nodule: Secondary | ICD-10-CM

## 2016-03-21 LAB — URINALYSIS, COMPLETE
Bilirubin, UA: NEGATIVE
GLUCOSE, UA: NEGATIVE
KETONES UA: NEGATIVE
Nitrite, UA: NEGATIVE
Protein, UA: NEGATIVE
Urobilinogen, Ur: 0.2 mg/dL (ref 0.2–1.0)
pH, UA: 5 (ref 5.0–7.5)

## 2016-03-21 LAB — MICROSCOPIC EXAMINATION

## 2016-03-21 MED ORDER — CIPROFLOXACIN HCL 500 MG PO TABS
500.0000 mg | ORAL_TABLET | Freq: Once | ORAL | Status: AC
  Administered 2016-03-21: 500 mg via ORAL

## 2016-03-21 MED ORDER — LIDOCAINE HCL 2 % EX GEL
1.0000 "application " | Freq: Once | CUTANEOUS | Status: AC
  Administered 2016-03-21: 1 via URETHRAL

## 2016-03-21 NOTE — Progress Notes (Signed)
   03/23/16  CC:  Chief Complaint  Patient presents with  . Cysto    HPI: 56 yo female with microscopic hematuria who presents today for cystoscopy to complete her microscopic hematuria workup.  She underwent CT urogram on 03/09/2016 which showed evidence of pelvic floor laxity as well as 2 incidental small pulmonary nodules, otherwise unremarkable.  She denies a history of gross hematuria.  She does have fairly significant urinary symptoms including urgency and frequency with occasional sensation of incomplete bladder emptying. She denies any other symptoms of pelvic organ prolapse including no vaginal bulging or pain with intercourse. She has discussed her prolapse with her OB/GYN Dr. Feliberto GottronSchermerhorn it is hesitant to proceed. She is taking Ditropan currently with minimal effect.  She is a never smoker.    Blood pressure 114/73, pulse 96, height 5\' 5"  (1.651 m), weight 157 lb (71.2 kg). NED. A&Ox3.   No respiratory distress   Abd soft, NT, ND Normal external genitalia with patent urethral meatus Notable for stage II cystocele to the vaginal introitus with concomitant apical prolapse appreciated.  Cystoscopy Procedure Note  Patient identification was confirmed, informed consent was obtained, and patient was prepped using Betadine solution.  Lidocaine jelly was administered per urethral meatus.    Preoperative abx where received prior to procedure.    Procedure: - Flexible cystoscope introduced, without any difficulty.   - Thorough search of the bladder revealed:    normal urethral meatus    normal urothelium    no stones    no ulcers     no tumors    no urethral polyps    no trabeculation    Descent of bladder neck and posterior wall of bladder appreciated secondary to cystocele  - Ureteral orifices were normal in position and appearance.  Post-Procedure: - Patient tolerated the procedure well  Assessment/ Plan:  1. Microscopic hematuria S/p complete workup including  CT urogram and cystoscopy, unremarkable - Urinalysis, Complete - ciprofloxacin (CIPRO) tablet 500 mg; Take 1 tablet (500 mg total) by mouth once. - lidocaine (XYLOCAINE) 2 % jelly 1 application; Place 1 application into the urethra once.  2. Midline cystocele Asymptomatic, more bothered by urinary urgency, frequency, and incomplete bladder intake which may be result of cystocele Briefly discussed options including repair of pelvic organ prolapse which may unmask stress urinary incontinence versus pessary Offered referral to Dr. Sherron MondayMacDiarmid declined at this time  3. Pulmonary nodule Incidental small pulmonary nodule 2 and CT urogram Never smoker, low risk therefore no follow-up indicated   No Follow-up on file.    Vanna ScotlandAshley Wynne Rozak, MD

## 2016-09-28 ENCOUNTER — Other Ambulatory Visit: Payer: Self-pay | Admitting: Obstetrics and Gynecology

## 2016-09-28 DIAGNOSIS — Z1231 Encounter for screening mammogram for malignant neoplasm of breast: Secondary | ICD-10-CM

## 2016-10-03 ENCOUNTER — Ambulatory Visit
Admission: RE | Admit: 2016-10-03 | Discharge: 2016-10-03 | Disposition: A | Discharge status: Home / Self Care | Payer: BC Managed Care – PPO | Source: Ambulatory Visit | Attending: Obstetrics and Gynecology | Admitting: Obstetrics and Gynecology

## 2016-10-03 DIAGNOSIS — Z1231 Encounter for screening mammogram for malignant neoplasm of breast: Secondary | ICD-10-CM | POA: Diagnosis not present

## 2016-11-16 DIAGNOSIS — N8110 Cystocele, unspecified: Secondary | ICD-10-CM

## 2016-11-16 HISTORY — DX: Cystocele, unspecified: N81.10

## 2016-11-21 NOTE — H&P (Signed)
Teresa Mueller is a 56 y.o. female here for Follow-up .  Pt is for follow up for pelvic relaxation  With cystocele  And uterine descensus . Main symptoms are significant urgency and nocturia . Does not have urinary retention . Marland Kitchen. No SUI . She is known to have a grade 3 cystocele and uterine descensus both of which are bothersome to her . She is sexually active . Marland Kitchen. No splinting with BM.  Pt was on vesicare then swicthed to mybetriq. ( didn't help )  She now is interested in surgery    Past Medical History:  has a past medical history of Cervicalgia, GERD (gastroesophageal reflux disease), Gestational diabetes, Intermittent asthma, and Sciatica.  Past Surgical History:  has a past surgical history that includes Septoplasty; Hernia repair; Cholecystectomy; and Augmentation mammaplasty. Family History: family history includes Breast cancer in her maternal aunt; Breast cancer (age of onset: 5045) in her sister; Breast cancer (age of onset: 6769) in her mother; Colon cancer (age of onset: 4583) in her mother. Social History:  reports that she has never smoked. She has never used smokeless tobacco. She reports that she drinks alcohol. OB/GYN History:          OB History    Gravida  2   Para  2   Term  2   Preterm      AB      Living  2     SAB      TAB      Ectopic      Molar      Multiple      Live Births  2          Allergies: has No Known Allergies. Medications:  Current Outpatient Medications:  .  omeprazole (PRILOSEC) 20 MG DR capsule, TAKE ONE (1) CAPSULE EACH DAY, Disp: 30 capsule, Rfl: 6 .  solifenacin (VESICARE) 10 MG tablet, Take 1 tablet (10 mg total) by mouth once daily, Disp: 30 tablet, Rfl: 11  Review of Systems: General:                      No fatigue or weight loss Eyes:                           No vision changes Ears:                            No hearing difficulty Respiratory:                No cough or shortness of breath Pulmonary:                   No asthma or shortness of breath Cardiovascular:           No chest pain, palpitations, dyspnea on exertion Gastrointestinal:          No abdominal bloating, chronic diarrhea, constipations, masses, pain or hematochezia Genitourinary:             No hematuria, dysuria, abnormal vaginal discharge, pelvic pain, Menometrorrhagia Lymphatic:                   No swollen lymph nodes Musculoskeletal:         No muscle weakness Neurologic:                  No extremity weakness,  syncope, seizure disorder Psychiatric:                  No history of depression, delusions or suicidal/homicidal ideation    Exam:      Vitals:   11/14/16 1657  BP: 121/79  Pulse: 86    Body mass index is 26.96 kg/m.  WDWN white/  female in NAD   Lungs: CTA  CV : RRR without murmur    Neck:  no thyromegaly Abdomen: soft , no mass, normal active bowel sounds,  non-tender, no rebound tenderness Pelvic: tanner stage 5 ,  External genitalia: vulva /labia no lesions Urethra: no prolapse Vagina: normal physiologic d/c, grade 3 cystocele , no rectocele Cervix: no lesions, no cervical motion tenderness  Uterus: normal size shape and contour, non-tender, second degree uterine descensus Adnexa:no mass, non-tender    Impression:   The primary encounter diagnosis was Dysuria. Diagnoses of Midline cystocele, Descens uteri, and OAB (overactive bladder) were also pertinent to this visit.    Plan:  TVH and BSO   anterior repair  Benefits and risks to surgery: The proposed benefit of the surgery has been discussed with the patient. The possible risks include, but are not limited to: organ injury to the bowel , bladder, ureters, and major blood vessels and nerves. There is a possibility of additional surgeries resulting from these injuries. There is also the risk of blood transfusion and the need to receive blood products during or after the procedure which may rarely lead to HIV or Hepatitis  C infection. There is a risk of developing a deep venous thrombosis or a pulmonary embolism . There is the possibility of wound infection and also anesthetic complications, even the rare possibility of death. The patient understands these risks and wishes to proceed. All questions have been answered and the consent has been signed.         Orders Placed This Encounter  Procedures  . Urine Culture, Routine - [facility]    Standing Status:   Future    Standing Expiration Date:   11/14/2017  . Urinalysis w/Microscopic    Standing Status:   Future    Standing Expiration Date:   11/14/2017      Vilma PraderHOMAS JANSE SCHERMERHORN, MD

## 2016-11-22 ENCOUNTER — Other Ambulatory Visit: Payer: Self-pay

## 2016-11-22 ENCOUNTER — Encounter
Admission: RE | Admit: 2016-11-22 | Discharge: 2016-11-22 | Disposition: A | Discharge status: Home / Self Care | Payer: BC Managed Care – PPO | Source: Ambulatory Visit | Attending: Obstetrics and Gynecology | Admitting: Obstetrics and Gynecology

## 2016-11-22 DIAGNOSIS — Z01812 Encounter for preprocedural laboratory examination: Secondary | ICD-10-CM | POA: Diagnosis present

## 2016-11-22 HISTORY — DX: Sciatica, unspecified side: M54.30

## 2016-11-22 HISTORY — DX: Other complications of anesthesia, initial encounter: T88.59XA

## 2016-11-22 HISTORY — DX: Adverse effect of unspecified anesthetic, initial encounter: T41.45XA

## 2016-11-22 HISTORY — DX: Cystocele, unspecified: N81.10

## 2016-11-22 HISTORY — DX: Urinary tract infection, site not specified: N39.0

## 2016-11-22 LAB — BASIC METABOLIC PANEL
Anion gap: 7 (ref 5–15)
BUN: 12 mg/dL (ref 6–20)
CALCIUM: 9.5 mg/dL (ref 8.9–10.3)
CO2: 28 mmol/L (ref 22–32)
Chloride: 102 mmol/L (ref 101–111)
Creatinine, Ser: 0.68 mg/dL (ref 0.44–1.00)
Glucose, Bld: 127 mg/dL — ABNORMAL HIGH (ref 65–99)
POTASSIUM: 3.9 mmol/L (ref 3.5–5.1)
Sodium: 137 mmol/L (ref 135–145)

## 2016-11-22 LAB — TYPE AND SCREEN
ABO/RH(D): A POS
ANTIBODY SCREEN: NEGATIVE

## 2016-11-22 LAB — CBC
HEMATOCRIT: 40.9 % (ref 35.0–47.0)
HEMOGLOBIN: 13.7 g/dL (ref 12.0–16.0)
MCH: 29.6 pg (ref 26.0–34.0)
MCHC: 33.6 g/dL (ref 32.0–36.0)
MCV: 88.1 fL (ref 80.0–100.0)
Platelets: 240 10*3/uL (ref 150–440)
RBC: 4.64 MIL/uL (ref 3.80–5.20)
RDW: 13.3 % (ref 11.5–14.5)
WBC: 6.1 10*3/uL (ref 3.6–11.0)

## 2016-11-22 MED ORDER — FLEET ENEMA 7-19 GM/118ML RE ENEM
1.0000 | ENEMA | Freq: Once | RECTAL | Status: DC
  Filled 2016-11-22: qty 1

## 2016-11-22 NOTE — Patient Instructions (Signed)
Your procedure is scheduled on: December 01, 2016  Report to [facility], 2ND FLOOR  To find out your arrival time please call [phone] between 1PM - 3PM on Thursday, November 30, 2016  Remember: Instructions that are not followed completely may result in serious medical risk, up to and including death, or upon the discretion of your surgeon and anesthesiologist your surgery may need to be rescheduled.     _X__ 1. Do not eat food after midnight the night before your procedure.                 No gum chewing or hard candies. You may drink clear liquids up to 2 hours                 before you are scheduled to arrive for your surgery- DO not drink clear                 liquids within 2 hours of the start of your surgery.                 Clear Liquids include:  water, apple juice without pulp, clear carbohydrate                 drink such as Clearfast of Gartorade, Black Coffee or Tea (Do not add                 anything to coffee or tea).     _X__ 2.  No Alcohol for 24 hours before or after surgery.   _X__ 3.  Do Not Smoke or use e-cigarettes For 24 Hours Prior to Your Surgery.                 Do not use any chewable tobacco products for at least 6 hours prior to                 surgery.  ____  4.  Bring all medications with you on the day of surgery if instructed.   ____  5.  Notify your doctor if there is any change in your medical condition      (cold, fever, infections).     Do not wear jewelry, make-up, hairpins, clips or nail polish. Do not wear lotions, powders, or perfumes. You may wear deodorant. Do not shave 48 hours prior to surgery. Men may shave face and neck. Do not bring valuables to the hospital.    [facility] not [facility] for any belongings or valuables.  Contacts, dentures or bridgework may not be worn into surgery. Leave your suitcase in the car. After surgery it may be brought to your room. For patients admitted to the  hospital, discharge time is determined by your treatment team.   Patients discharged the day of surgery will not be allowed to drive home.   Please read over the following fact sheets that you were given:   Preparing for surgery    ____ Take these medicines the morning of surgery with A SIP OF WATER:    1. Prilosec, take the night before and the morning of surgery  2.   3.   4.  5.  6.  __x__ Fleet Enema (as directed) USE THE MORNING OF SURGERY  __X__ Use CHG Soap as directed... THE NIGHT BEFORE SURGERY AND THE MORNING OF SURGERY  ____ Use inhalers on the day of surgery  ____ Stop metformin 2 days prior to surgery    ____ Take 1/2  of usual insulin dose the night before surgery. No insulin the morning          of surgery.   __X__ Stop Anti-inflammatories on November 9TH, 2018.  THIS INCLUDES IBUPROFEN/ADVIL AND ALEVE   ____ Stop supplements until after surgery.    ____ Bring C-Pap to the hospital.      CONTINUE YOUR VESICARE UP UNTIL THE DAY BEFORE SURGERY.   PRACTICE INCENTIVE SPIROMETER PRIOR TO SURGERY.

## 2016-11-22 NOTE — Pre-Procedure Instructions (Signed)
Patient provided with an incentive spirometer with instructions for use. She was able to demonstrate proper understanding of its use.  Also, gave patient a Fleets enema for home use on the day of surgery.

## 2016-12-01 ENCOUNTER — Encounter
Admission: RE | Disposition: A | Discharge status: Home / Self Care | Payer: Self-pay | Source: Ambulatory Visit | Attending: Obstetrics and Gynecology

## 2016-12-01 ENCOUNTER — Inpatient Hospital Stay: Payer: BC Managed Care – PPO | Admitting: Anesthesiology

## 2016-12-01 ENCOUNTER — Encounter: Payer: Self-pay | Admitting: *Deleted

## 2016-12-01 ENCOUNTER — Other Ambulatory Visit: Payer: Self-pay

## 2016-12-01 ENCOUNTER — Observation Stay
Admission: RE | Admit: 2016-12-01 | Discharge: 2016-12-02 | Disposition: A | Discharge status: Home / Self Care | Payer: BC Managed Care – PPO | Source: Ambulatory Visit | Attending: Obstetrics and Gynecology | Admitting: Obstetrics and Gynecology

## 2016-12-01 DIAGNOSIS — N838 Other noninflammatory disorders of ovary, fallopian tube and broad ligament: Secondary | ICD-10-CM | POA: Diagnosis not present

## 2016-12-01 DIAGNOSIS — N800 Endometriosis of uterus: Secondary | ICD-10-CM | POA: Insufficient documentation

## 2016-12-01 DIAGNOSIS — N8189 Other female genital prolapse: Secondary | ICD-10-CM | POA: Insufficient documentation

## 2016-12-01 DIAGNOSIS — K219 Gastro-esophageal reflux disease without esophagitis: Secondary | ICD-10-CM | POA: Insufficient documentation

## 2016-12-01 DIAGNOSIS — Z9889 Other specified postprocedural states: Secondary | ICD-10-CM

## 2016-12-01 DIAGNOSIS — N8110 Cystocele, unspecified: Secondary | ICD-10-CM | POA: Diagnosis not present

## 2016-12-01 HISTORY — PX: SALPINGOOPHORECTOMY: SHX82

## 2016-12-01 HISTORY — PX: VAGINAL HYSTERECTOMY: SHX2639

## 2016-12-01 HISTORY — PX: CYSTOCELE REPAIR: SHX163

## 2016-12-01 LAB — ABO/RH: ABO/RH(D): A POS

## 2016-12-01 SURGERY — HYSTERECTOMY, VAGINAL
Anesthesia: General

## 2016-12-01 MED ORDER — LIDOCAINE HCL (CARDIAC) 20 MG/ML IV SOLN
INTRAVENOUS | Status: DC | PRN
  Administered 2016-12-01: 100 mg via INTRAVENOUS

## 2016-12-01 MED ORDER — ONDANSETRON HCL 4 MG PO TABS: 4.0000 mg | ORAL_TABLET | Freq: Four times a day (QID) | ORAL | Status: DC | PRN

## 2016-12-01 MED ORDER — SIMETHICONE 80 MG PO CHEW: 80.0000 mg | CHEWABLE_TABLET | Freq: Four times a day (QID) | ORAL | Status: DC | PRN

## 2016-12-01 MED ORDER — CEFAZOLIN SODIUM-DEXTROSE 2-4 GM/100ML-% IV SOLN
INTRAVENOUS | Status: AC
  Filled 2016-12-01: qty 100

## 2016-12-01 MED ORDER — ONDANSETRON HCL 4 MG/2ML IJ SOLN
INTRAMUSCULAR | Status: AC
  Filled 2016-12-01: qty 2

## 2016-12-01 MED ORDER — DEXAMETHASONE SODIUM PHOSPHATE 10 MG/ML IJ SOLN
INTRAMUSCULAR | Status: DC | PRN
  Administered 2016-12-01: 10 mg via INTRAVENOUS

## 2016-12-01 MED ORDER — MORPHINE SULFATE (PF) 2 MG/ML IV SOLN
1.0000 mg | INTRAVENOUS | Status: DC | PRN
  Administered 2016-12-01: 2 mg via INTRAVENOUS
  Filled 2016-12-01: qty 1

## 2016-12-01 MED ORDER — SCOPOLAMINE 1 MG/3DAYS TD PT72: 1.0000 | MEDICATED_PATCH | TRANSDERMAL | Status: DC

## 2016-12-01 MED ORDER — SULFANILAMIDE 15 % VA CREA
TOPICAL_CREAM | VAGINAL | Status: AC
  Filled 2016-12-01: qty 120

## 2016-12-01 MED ORDER — PROPOFOL 10 MG/ML IV BOLUS
INTRAVENOUS | Status: AC
  Filled 2016-12-01: qty 20

## 2016-12-01 MED ORDER — ONDANSETRON HCL 4 MG/2ML IJ SOLN
4.0000 mg | Freq: Four times a day (QID) | INTRAMUSCULAR | Status: DC | PRN
  Administered 2016-12-01 (×2): 4 mg via INTRAVENOUS
  Filled 2016-12-01 (×2): qty 2

## 2016-12-01 MED ORDER — FENTANYL CITRATE (PF) 250 MCG/5ML IJ SOLN
INTRAMUSCULAR | Status: AC
  Filled 2016-12-01: qty 5

## 2016-12-01 MED ORDER — MIDAZOLAM HCL 2 MG/2ML IJ SOLN
INTRAMUSCULAR | Status: DC | PRN
  Administered 2016-12-01: 2 mg via INTRAVENOUS

## 2016-12-01 MED ORDER — LIDOCAINE HCL (PF) 2 % IJ SOLN
INTRAMUSCULAR | Status: AC
  Filled 2016-12-01: qty 10

## 2016-12-01 MED ORDER — FENTANYL CITRATE (PF) 100 MCG/2ML IJ SOLN
INTRAMUSCULAR | Status: AC
  Filled 2016-12-01: qty 2

## 2016-12-01 MED ORDER — OXYCODONE-ACETAMINOPHEN 5-325 MG PO TABS: 1.0000 | ORAL_TABLET | ORAL | Status: DC | PRN

## 2016-12-01 MED ORDER — DEXAMETHASONE SODIUM PHOSPHATE 10 MG/ML IJ SOLN
INTRAMUSCULAR | Status: AC
  Filled 2016-12-01: qty 1

## 2016-12-01 MED ORDER — SODIUM CHLORIDE 0.9 % IJ SOLN
INTRAMUSCULAR | Status: AC
  Filled 2016-12-01: qty 10

## 2016-12-01 MED ORDER — DIPHENHYDRAMINE HCL 50 MG/ML IJ SOLN
INTRAMUSCULAR | Status: DC | PRN
  Administered 2016-12-01: 6.25 mg via INTRAVENOUS

## 2016-12-01 MED ORDER — ONDANSETRON HCL 4 MG/2ML IJ SOLN
INTRAMUSCULAR | Status: DC | PRN
  Administered 2016-12-01 (×2): 4 mg via INTRAVENOUS

## 2016-12-01 MED ORDER — FENTANYL CITRATE (PF) 100 MCG/2ML IJ SOLN
INTRAMUSCULAR | Status: DC | PRN
  Administered 2016-12-01 (×3): 50 ug via INTRAVENOUS
  Administered 2016-12-01: 100 ug via INTRAVENOUS

## 2016-12-01 MED ORDER — OXYCODONE HCL 5 MG/5ML PO SOLN: 5.0000 mg | Freq: Once | ORAL | Status: DC | PRN

## 2016-12-01 MED ORDER — PHENYLEPHRINE HCL 10 MG/ML IJ SOLN
INTRAMUSCULAR | Status: AC
  Filled 2016-12-01: qty 1

## 2016-12-01 MED ORDER — ACETAMINOPHEN 10 MG/ML IV SOLN
INTRAVENOUS | Status: AC
  Administered 2016-12-01: 1000 mg via INTRAVENOUS
  Filled 2016-12-01: qty 100

## 2016-12-01 MED ORDER — MIDAZOLAM HCL 2 MG/2ML IJ SOLN
INTRAMUSCULAR | Status: AC
  Filled 2016-12-01: qty 2

## 2016-12-01 MED ORDER — PROPOFOL 500 MG/50ML IV EMUL
INTRAVENOUS | Status: AC
  Filled 2016-12-01: qty 50

## 2016-12-01 MED ORDER — PANTOPRAZOLE SODIUM 40 MG PO TBEC
40.0000 mg | DELAYED_RELEASE_TABLET | Freq: Every day | ORAL | Status: DC
  Administered 2016-12-01: 40 mg via ORAL
  Filled 2016-12-01: qty 1

## 2016-12-01 MED ORDER — SEVOFLURANE IN SOLN
RESPIRATORY_TRACT | Status: AC
  Filled 2016-12-01: qty 250

## 2016-12-01 MED ORDER — FENTANYL CITRATE (PF) 100 MCG/2ML IJ SOLN
INTRAMUSCULAR | Status: AC
  Administered 2016-12-01: 25 ug via INTRAVENOUS
  Filled 2016-12-01: qty 2

## 2016-12-01 MED ORDER — LACTATED RINGERS IV SOLN
INTRAVENOUS | Status: DC
  Administered 2016-12-01 – 2016-12-02 (×2): via INTRAVENOUS

## 2016-12-01 MED ORDER — LIDOCAINE-EPINEPHRINE 1 %-1:100000 IJ SOLN
INTRAMUSCULAR | Status: AC
  Filled 2016-12-01: qty 2

## 2016-12-01 MED ORDER — PROPOFOL 500 MG/50ML IV EMUL
INTRAVENOUS | Status: DC | PRN
  Administered 2016-12-01: 100 ug/kg/min via INTRAVENOUS

## 2016-12-01 MED ORDER — SUGAMMADEX SODIUM 200 MG/2ML IV SOLN
INTRAVENOUS | Status: DC | PRN
  Administered 2016-12-01: 144.2 mg via INTRAVENOUS

## 2016-12-01 MED ORDER — SODIUM CHLORIDE FLUSH 0.9 % IV SOLN
INTRAVENOUS | Status: AC
Start: 2016-12-01 — End: 2016-12-01
  Filled 2016-12-01: qty 10

## 2016-12-01 MED ORDER — ROCURONIUM BROMIDE 50 MG/5ML IV SOLN
INTRAVENOUS | Status: AC
  Filled 2016-12-01: qty 1

## 2016-12-01 MED ORDER — PROMETHAZINE HCL 25 MG PO TABS
25.0000 mg | ORAL_TABLET | ORAL | Status: DC | PRN
  Filled 2016-12-01: qty 1

## 2016-12-01 MED ORDER — ESTROGENS, CONJUGATED 0.625 MG/GM VA CREA
TOPICAL_CREAM | VAGINAL | Status: AC
  Filled 2016-12-01: qty 30

## 2016-12-01 MED ORDER — ESTROGENS, CONJUGATED 0.625 MG/GM VA CREA
TOPICAL_CREAM | VAGINAL | Status: DC | PRN
  Administered 2016-12-01: 1 via VAGINAL

## 2016-12-01 MED ORDER — SCOPOLAMINE 1 MG/3DAYS TD PT72
MEDICATED_PATCH | TRANSDERMAL | Status: AC
  Administered 2016-12-01: 1.5 mg
  Filled 2016-12-01: qty 1

## 2016-12-01 MED ORDER — PROMETHAZINE HCL 25 MG/ML IJ SOLN
INTRAMUSCULAR | Status: AC
Start: 2016-12-01 — End: 2016-12-01
  Administered 2016-12-01: 6.25 mg via INTRAVENOUS
  Filled 2016-12-01: qty 1

## 2016-12-01 MED ORDER — KETOROLAC TROMETHAMINE 30 MG/ML IJ SOLN
30.0000 mg | Freq: Three times a day (TID) | INTRAMUSCULAR | Status: DC
  Administered 2016-12-01 – 2016-12-02 (×3): 30 mg via INTRAVENOUS
  Filled 2016-12-01 (×4): qty 1

## 2016-12-01 MED ORDER — LACTATED RINGERS IV SOLN
INTRAVENOUS | Status: DC
  Administered 2016-12-01: 07:00:00 via INTRAVENOUS

## 2016-12-01 MED ORDER — PROPOFOL 10 MG/ML IV BOLUS
INTRAVENOUS | Status: DC | PRN
  Administered 2016-12-01: 150 mg via INTRAVENOUS

## 2016-12-01 MED ORDER — FENTANYL CITRATE (PF) 100 MCG/2ML IJ SOLN
25.0000 ug | INTRAMUSCULAR | Status: DC | PRN
  Administered 2016-12-01 (×2): 25 ug via INTRAVENOUS

## 2016-12-01 MED ORDER — ROCURONIUM BROMIDE 100 MG/10ML IV SOLN
INTRAVENOUS | Status: DC | PRN
  Administered 2016-12-01: 10 mg via INTRAVENOUS
  Administered 2016-12-01 (×2): 50 mg via INTRAVENOUS
  Administered 2016-12-01: 10 mg via INTRAVENOUS

## 2016-12-01 MED ORDER — OXYCODONE HCL 5 MG PO TABS: 5.0000 mg | ORAL_TABLET | Freq: Once | ORAL | Status: DC | PRN

## 2016-12-01 MED ORDER — SUCCINYLCHOLINE CHLORIDE 20 MG/ML IJ SOLN
INTRAMUSCULAR | Status: AC
  Filled 2016-12-01: qty 1

## 2016-12-01 MED ORDER — PROMETHAZINE HCL 25 MG/ML IJ SOLN
6.2500 mg | INTRAMUSCULAR | Status: AC | PRN
  Administered 2016-12-01 (×2): 6.25 mg via INTRAVENOUS

## 2016-12-01 MED ORDER — SODIUM CHLORIDE 0.9 % IJ SOLN
INTRAMUSCULAR | Status: AC
  Administered 2016-12-01: 10 mL
  Filled 2016-12-01: qty 10

## 2016-12-01 MED ORDER — LIDOCAINE-EPINEPHRINE 1 %-1:100000 IJ SOLN
INTRAMUSCULAR | Status: DC | PRN
  Administered 2016-12-01: 10 mL
  Administered 2016-12-01: 2 mL

## 2016-12-01 MED ORDER — KETOROLAC TROMETHAMINE 30 MG/ML IJ SOLN
INTRAMUSCULAR | Status: DC | PRN
  Administered 2016-12-01: 30 mg via INTRAVENOUS

## 2016-12-01 MED ORDER — ACETAMINOPHEN 10 MG/ML IV SOLN
1000.0000 mg | Freq: Once | INTRAVENOUS | Status: AC
  Administered 2016-12-01: 1000 mg via INTRAVENOUS

## 2016-12-01 MED ORDER — PROMETHAZINE HCL 25 MG/ML IJ SOLN
25.0000 mg | INTRAMUSCULAR | Status: DC | PRN
  Administered 2016-12-01: 25 mg via INTRAVENOUS
  Filled 2016-12-01: qty 1

## 2016-12-01 MED ORDER — CEFAZOLIN SODIUM-DEXTROSE 2-4 GM/100ML-% IV SOLN
2.0000 g | Freq: Once | INTRAVENOUS | Status: AC
  Administered 2016-12-01: 2 g via INTRAVENOUS

## 2016-12-01 SURGICAL SUPPLY — 44 items
BAG URO DRAIN 2000ML W/SPOUT (MISCELLANEOUS) ×4 IMPLANT
BLADE SURG 15 STRL LF DISP TIS (BLADE) ×2 IMPLANT
BLADE SURG 15 STRL SS (BLADE) ×2
CANISTER SUCT 1200ML W/VALVE (MISCELLANEOUS) ×4 IMPLANT
CATH FOLEY 2WAY  5CC 16FR (CATHETERS) ×2
CATH ROBINSON RED A/P 16FR (CATHETERS) ×4 IMPLANT
CATH URTH 16FR FL 2W BLN LF (CATHETERS) ×2 IMPLANT
DRAPE PERI LITHO V/GYN (MISCELLANEOUS) ×4 IMPLANT
DRAPE SURG 17X11 SM STRL (DRAPES) ×4 IMPLANT
DRAPE UNDER BUTTOCK W/FLU (DRAPES) ×4 IMPLANT
ELECT REM PT RETURN 9FT ADLT (ELECTROSURGICAL) ×4
ELECTRODE REM PT RTRN 9FT ADLT (ELECTROSURGICAL) ×2 IMPLANT
GAUZE PACK 2X3YD (MISCELLANEOUS) ×4 IMPLANT
GLOVE BIO SURGEON STRL SZ8 (GLOVE) ×4 IMPLANT
GOWN STRL REUS W/ TWL LRG LVL3 (GOWN DISPOSABLE) ×6 IMPLANT
GOWN STRL REUS W/ TWL XL LVL3 (GOWN DISPOSABLE) ×2 IMPLANT
GOWN STRL REUS W/TWL LRG LVL3 (GOWN DISPOSABLE) ×6
GOWN STRL REUS W/TWL XL LVL3 (GOWN DISPOSABLE) ×2
JELLY LUB 2OZ STRL (MISCELLANEOUS) ×2
JELLY LUBE 2OZ STRL (MISCELLANEOUS) ×2 IMPLANT
KIT RM TURNOVER CYSTO AR (KITS) ×4 IMPLANT
KIT RM TURNOVER STRD PROC AR (KITS) ×4 IMPLANT
LABEL OR SOLS (LABEL) ×4 IMPLANT
NDL SAFETY 22GX1.5 (NEEDLE) ×4 IMPLANT
NS IRRIG 500ML POUR BTL (IV SOLUTION) ×4 IMPLANT
PACK BASIN MINOR ARMC (MISCELLANEOUS) ×4 IMPLANT
PAD OB MATERNITY 4.3X12.25 (PERSONAL CARE ITEMS) ×4 IMPLANT
PAD PREP 24X41 OB/GYN DISP (PERSONAL CARE ITEMS) ×4 IMPLANT
SOL PREP PVP 2OZ (MISCELLANEOUS) ×4
SOLUTION PREP PVP 2OZ (MISCELLANEOUS) ×2 IMPLANT
SPONGE XRAY 4X4 16PLY STRL (MISCELLANEOUS) ×4 IMPLANT
SUT PDS 2-0 27IN (SUTURE) ×4 IMPLANT
SUT PDS AB 2-0 CT1 27 (SUTURE) IMPLANT
SUT VIC AB 0 CT1 27 (SUTURE) ×4
SUT VIC AB 0 CT1 27XCR 8 STRN (SUTURE) ×4 IMPLANT
SUT VIC AB 0 CT1 36 (SUTURE) ×8 IMPLANT
SUT VIC AB 2-0 CT1 36 (SUTURE) IMPLANT
SUT VIC AB 2-0 SH 27 (SUTURE) ×2
SUT VIC AB 2-0 SH 27XBRD (SUTURE) ×2 IMPLANT
SUT VIC AB 3-0 SH 27 (SUTURE)
SUT VIC AB 3-0 SH 27X BRD (SUTURE) IMPLANT
SYR CONTROL 10ML (SYRINGE) ×4 IMPLANT
SYRINGE 10CC LL (SYRINGE) ×4 IMPLANT
WATER STERILE IRR 1000ML POUR (IV SOLUTION) IMPLANT

## 2016-12-01 NOTE — Progress Notes (Signed)
Patient ID: [name], female   DOB: 11/03/1960, 56 y.o.   MRN: [id number] DOS TVH / BSO and anterior repair  Nausea today  Better now with phenergan  VSS  Urine output good  VAgina packing removed  A: stable  Voiding trial in am

## 2016-12-01 NOTE — Anesthesia Procedure Notes (Signed)
Procedure Name: Intubation Date/Time: 12/01/2016 7:49 AM Performed by: Deri FuellingPrivette, Josiah Wojtaszek, CRNA Pre-anesthesia Checklist: Patient identified, Emergency Drugs available, Suction available, Patient being monitored and Timeout performed Patient Re-evaluated:Patient Re-evaluated prior to induction Oxygen Delivery Method: Circle system utilized Preoxygenation: Pre-oxygenation with 100% oxygen Induction Type: IV induction Ventilation: Mask ventilation without difficulty Laryngoscope Size: McGraph and 3 Grade View: Grade II Tube type: Oral Tube size: 6.5 mm Number of attempts: 1 Airway Equipment and Method: Stylet Placement Confirmation: ETT inserted through vocal cords under direct vision,  positive ETCO2,  CO2 detector and breath sounds checked- equal and bilateral Secured at: 20 cm Tube secured with: Tape Dental Injury: Teeth and Oropharynx as per pre-operative assessment

## 2016-12-01 NOTE — Anesthesia Post-op Follow-up Note (Signed)
Anesthesia QCDR form completed.        

## 2016-12-01 NOTE — OR Nursing (Signed)
Patient had an episode of vomiting phenergan given med given for pain

## 2016-12-01 NOTE — Transfer of Care (Signed)
Immediate Anesthesia Transfer of Care Note  Patient: Teresa MassonMargaret G Mueller  Procedure(s) Performed: HYSTERECTOMY VAGINAL (N/A ) SALPINGO OOPHORECTOMY (Bilateral ) ANTERIOR REPAIR (CYSTOCELE) (N/A )  Patient Location: PACU  Anesthesia Type:General  Level of Consciousness: alert   Airway & Oxygen Therapy: Patient Spontanous Breathing  Post-op Assessment: Report given to RN  Post vital signs: Reviewed  Last Vitals:  Vitals:   12/01/16 0616  BP: (!) 115/55  Pulse: 90  Resp: 16  Temp: (!) 36.3 C  SpO2: 98%    Last Pain:  Vitals:   12/01/16 0616  TempSrc: Oral         Complications: No apparent anesthesia complications

## 2016-12-01 NOTE — Anesthesia Preprocedure Evaluation (Signed)
Anesthesia Evaluation  Patient identified by MRN, date of birth, ID band Patient awake    Reviewed: Allergy & Precautions, H&P , NPO status , Patient's Chart, lab work & pertinent test results  History of Anesthesia Complications (+) history of anesthetic complications  Airway Mallampati: III  TM Distance: <3 FB Neck ROM: full    Dental  (+) Chipped   Pulmonary neg shortness of breath, asthma ,           Cardiovascular Exercise Tolerance: Good (-) angina(-) Past MI and (-) DOE negative cardio ROS       Neuro/Psych  Neuromuscular disease negative psych ROS   GI/Hepatic Neg liver ROS, GERD  Medicated and Controlled,  Endo/Other  negative endocrine ROS  Renal/GU      Musculoskeletal   Abdominal   Peds  Hematology negative hematology ROS (+)   Anesthesia Other Findings Past Medical History: No date: Asthma     Comment:  patient denies asthma, does not use inhalers 2016: BRCA negative No date: Complication of anesthesia     Comment:  severe nausea and vomitting after general anesthesia 11/2016: Cystocele without uterine prolapse No date: GERD (gastroesophageal reflux disease) No date: Sciatica No date: UTI (urinary tract infection)     Comment:  frequent  Past Surgical History: 2016: AUGMENTATION MAMMAPLASTY; Bilateral     Comment:  SALINE - RETROPECTORAL 2016: CHOLECYSTECTOMY 1970: HERNIA REPAIR     Comment:  inguinal 1995: NASAL SINUS SURGERY  BMI    Body Mass Index:  26.46 kg/m      Reproductive/Obstetrics negative OB ROS                             Anesthesia Physical Anesthesia Plan  ASA: III  Anesthesia Plan: General ETT   Post-op Pain Management:    Induction: Intravenous  PONV Risk Score and Plan: 4 or greater and Ondansetron, Propofol infusion, Midazolam, Dexamethasone and Scopolamine patch - Pre-op  Airway Management Planned: Oral ETT  Additional  Equipment:   Intra-op Plan:   Post-operative Plan: Extubation in OR  Informed Consent: I have reviewed the patients History and Physical, chart, labs and discussed the procedure including the risks, benefits and alternatives for the proposed anesthesia with the patient or authorized representative who has indicated his/her understanding and acceptance.   Dental Advisory Given  Plan Discussed with: Anesthesiologist, CRNA and Surgeon  Anesthesia Plan Comments: (Patient consented for risks of anesthesia including but not limited to:  - adverse reactions to medications - damage to teeth, lips or other oral mucosa - sore throat or hoarseness - Damage to heart, brain, lungs or loss of life  Patient voiced understanding.)        Anesthesia Quick Evaluation

## 2016-12-01 NOTE — Progress Notes (Signed)
[facility] . Labs reviewed . NPO  Ready to proceed

## 2016-12-01 NOTE — Brief Op Note (Signed)
12/01/2016  9:36 AM  PATIENT:  Lars MassonMargaret G Hanko  56 y.o. female  PRE-OPERATIVE DIAGNOSIS:  Pelvic Relaxation  POST-OPERATIVE DIAGNOSIS:  Pelvic Relaxation  PROCEDURE:  Procedure(s): HYSTERECTOMY VAGINAL (N/A) SALPINGO OOPHORECTOMY (Bilateral) ANTERIOR REPAIR (CYSTOCELE) (N/A)  SURGEON:  Surgeon(s) and Role:    * Franshesca Chipman, Ihor Austinhomas J, MD - Primary  PHYSICIAN ASSISTANT: Dalbert GarnetBeasley MD  ASSISTANTS: scrub tech   ANESTHESIA:   general  EBL:  100 mL   BLOOD ADMINISTERED:none  DRAINS: Urinary Catheter (Foley)   LOCAL MEDICATIONS USED:  LIDOCAINE   SPECIMEN:  Source of Specimen:  cervix , uterus , bilateral tubes and ovaries  DISPOSITION OF SPECIMEN:  PATHOLOGY  COUNTS:  YES  TOURNIQUET:  * No tourniquets in log *  DICTATION: .verbal PLAN OF CARE: Admit for overnight observation  PATIENT DISPOSITION:  PACU - hemodynamically stable.   Delay start of Pharmacological VTE agent (>24hrs) due to surgical blood loss or risk of bleeding: not applicable

## 2016-12-01 NOTE — Anesthesia Postprocedure Evaluation (Signed)
Anesthesia Post Note  Patient: Lars MassonMargaret G Klemz  Procedure(s) Performed: HYSTERECTOMY VAGINAL (N/A ) SALPINGO OOPHORECTOMY (Bilateral ) ANTERIOR REPAIR (CYSTOCELE) (N/A )  Patient location during evaluation: PACU Anesthesia Type: General Level of consciousness: awake and alert Pain management: pain level controlled Vital Signs Assessment: post-procedure vital signs reviewed and stable Respiratory status: spontaneous breathing, nonlabored ventilation, respiratory function stable and patient connected to nasal cannula oxygen Cardiovascular status: blood pressure returned to baseline and stable Postop Assessment: no apparent nausea or vomiting Anesthetic complications: no Comments: Patient has PONV with both volatile anesthetics and narcotics as she has PONV with a TIVA     Last Vitals:  Vitals:   12/01/16 1152 12/01/16 1303  BP: 115/82 135/70  Pulse: (!) 105 100  Resp: 20 18  Temp: 36.8 C 36.9 C  SpO2: 98% 98%    Last Pain:  Vitals:   12/01/16 1307  TempSrc:   PainSc: Asleep                 Cleda MccreedyJoseph K Areli Jowett

## 2016-12-02 ENCOUNTER — Encounter: Payer: Self-pay | Admitting: Obstetrics and Gynecology

## 2016-12-02 DIAGNOSIS — N8110 Cystocele, unspecified: Secondary | ICD-10-CM | POA: Diagnosis not present

## 2016-12-02 LAB — CBC
HCT: 39.2 % (ref 35.0–47.0)
Hemoglobin: 13.1 g/dL (ref 12.0–16.0)
MCH: 29.5 pg (ref 26.0–34.0)
MCHC: 33.5 g/dL (ref 32.0–36.0)
MCV: 88 fL (ref 80.0–100.0)
Platelets: 235 10*3/uL (ref 150–440)
RBC: 4.45 MIL/uL (ref 3.80–5.20)
RDW: 13.3 % (ref 11.5–14.5)
WBC: 12.4 10*3/uL — ABNORMAL HIGH (ref 3.6–11.0)

## 2016-12-02 LAB — BASIC METABOLIC PANEL
Anion gap: 9 (ref 5–15)
BUN: 15 mg/dL (ref 6–20)
CO2: 27 mmol/L (ref 22–32)
CREATININE: 0.83 mg/dL (ref 0.44–1.00)
Calcium: 9.7 mg/dL (ref 8.9–10.3)
Chloride: 99 mmol/L — ABNORMAL LOW (ref 101–111)
GFR calc Af Amer: >60 mL/min (ref >60)
GFR calc non Af Amer: >60 mL/min (ref >60)
GLUCOSE: 139 mg/dL — AB (ref 65–99)
POTASSIUM: 4.3 mmol/L (ref 3.5–5.1)
SODIUM: 135 mmol/L (ref 135–145)

## 2016-12-02 MED ORDER — OXYCODONE-ACETAMINOPHEN 5-325 MG PO TABS
1.0000 | ORAL_TABLET | ORAL | 0 refills | Status: AC | PRN
Start: 2016-12-02 — End: ?

## 2016-12-02 MED ORDER — IBUPROFEN 800 MG PO TABS: 800.0000 mg | ORAL_TABLET | Freq: Three times a day (TID) | ORAL | 0 refills | Status: AC | PRN

## 2016-12-02 MED ORDER — SIMETHICONE 80 MG PO CHEW: 80.0000 mg | CHEWABLE_TABLET | Freq: Four times a day (QID) | ORAL | 0 refills | Status: AC | PRN

## 2016-12-02 MED ORDER — ONDANSETRON HCL 4 MG PO TABS: 4.0000 mg | ORAL_TABLET | Freq: Four times a day (QID) | ORAL | 0 refills | Status: AC | PRN

## 2016-12-02 NOTE — Progress Notes (Signed)
D/C instructions provided, pt states understanding, aware of follow up appt. Prescription given to pt.  D/C home to car via in wheelchair.

## 2016-12-02 NOTE — Discharge Summary (Signed)
Physician Discharge Summary  Patient ID: Teresa MassonMargaret G Mueller MRN: [id number] DOB/AGE: 57/03/1960 57 y.o.  Admit date: 12/01/2016 Discharge date: 12/02/2016  Admission Diagnoses: pelvic relaxation   Discharge Diagnoses:  Active Problems:   Postoperative state   Discharged Condition: good  Hospital Course: uncomplicated TVH and BSO  And anterior colporrhaphy  Consults: None  Significant Diagnostic Studies: labs:  Results for orders placed or performed during the hospital encounter of 12/01/16 (from the past 24 hour(s))  CBC     Status: Abnormal   Collection Time: 12/02/16  6:39 AM  Result Value Ref Range   WBC 12.4 (H) 3.6 - 11.0 K/uL   RBC 4.45 3.80 - 5.20 MIL/uL   Hemoglobin 13.1 12.0 - 16.0 g/dL   HCT 40.939.2 81.135.0 - 91.447.0 %   MCV 88.0 80.0 - 100.0 fL   MCH 29.5 26.0 - 34.0 pg   MCHC 33.5 32.0 - 36.0 g/dL   RDW 78.213.3 95.611.5 - 21.314.5 %   Platelets 235 150 - 440 K/uL  Basic metabolic panel     Status: Abnormal   Collection Time: 12/02/16  6:39 AM  Result Value Ref Range   Sodium 135 135 - 145 mmol/L   Potassium 4.3 3.5 - 5.1 mmol/L   Chloride 99 (L) 101 - 111 mmol/L   CO2 27 22 - 32 mmol/L   Glucose, Bld 139 (H) 65 - 99 mg/dL   BUN 15 6 - 20 mg/dL   Creatinine, Ser 0.860.83 0.44 - 1.00 mg/dL   Calcium 9.7 8.9 - 57.810.3 mg/dL   GFR calc non Af Amer >60 >60 mL/min   GFR calc Af Amer >60 >60 mL/min   Anion gap 9 5 - 15      Treatments: surgery: as above  Pt passed voiding trial on day of discharge with 40 cc residual urine   Discharge Exam: Blood pressure 116/72, pulse 86, temperature 97.9 F (36.6 C), temperature source Oral, resp. rate 18, height 5\' 5"  (1.651 m), weight 72.1 kg (159 lb), SpO2 100 %. General appearance: alert and cooperative Abd ; soft  NT   Disposition:   Discharge Instructions    Call MD for:   Complete by:  As directed    Urinary retention   Call MD for:  difficulty breathing, headache or visual disturbances   Complete by:  As directed    Call MD  for:  extreme fatigue   Complete by:  As directed    Call MD for:  hives   Complete by:  As directed    Call MD for:  persistant dizziness or light-headedness   Complete by:  As directed    Call MD for:  persistant nausea and vomiting   Complete by:  As directed    Call MD for:  redness, tenderness, or signs of infection (pain, swelling, redness, odor or green/yellow discharge around incision site)   Complete by:  As directed    Call MD for:  severe uncontrolled pain   Complete by:  As directed    Call MD for:  temperature >100.4   Complete by:  As directed    Diet - low sodium heart healthy   Complete by:  As directed    Increase activity slowly   Complete by:  As directed      Allergies as of 12/02/2016   No Active Allergies     Medication List    TAKE these medications   ibuprofen 800 MG tablet Commonly known as:  ADVIL,MOTRIN Take  1 tablet (800 mg total) every 8 (eight) hours as needed by mouth for moderate pain. What changed:    medication strength  how much to take  when to take this  reasons to take this   omeprazole 20 MG capsule Commonly known as:  PRILOSEC Take 20 mgs by mouth every evening   ondansetron 4 MG tablet Commonly known as:  ZOFRAN Take 1 tablet (4 mg total) every 6 (six) hours as needed by mouth for nausea.   oxyCODONE-acetaminophen 5-325 MG tablet Commonly known as:  PERCOCET/ROXICET Take 1-2 tablets every 4 (four) hours as needed by mouth (moderate to severe pain (when tolerating fluids)).   simethicone 80 MG chewable tablet Commonly known as:  MYLICON Chew 1 tablet (80 mg total) 4 (four) times daily as needed by mouth for flatulence.   sodium chloride 0.65 % Soln nasal spray Commonly known as:  OCEAN Place 1 spray into both nostrils as needed for congestion.   solifenacin 10 MG tablet Commonly known as:  VESICARE Take 10 mg by mouth every evening.   VISINE OP Apply 1 drop to eye daily as needed (dry eyes).      Follow-up  Information    Arryanna Holquin, Ihor Austinhomas J, MD Follow up in 2 week(s).   Specialty:  Obstetrics and Gynecology Why:  post op check  Contact information: [address] [facility] [place] [id number] [phone]           Signed: Ihor Austinhomas J Jaycelyn Orrison 12/02/2016, 10:30 AM

## 2016-12-04 LAB — SURGICAL PATHOLOGY

## 2016-12-04 NOTE — Op Note (Signed)
NAME:  Teresa Mueller, [name]              ACCOUNT NO.:  [id number]  MEDICAL RECORD NO.:  [id number]  LOCATION:                                 FACILITY:  PHYSICIAN:  Jennell Cornerhomas Schermerhorn, MD     DATE OF BIRTH:  DATE OF PROCEDURE:  12/01/2016 DATE OF DISCHARGE:                              OPERATIVE REPORT   PREOPERATIVE DIAGNOSIS:  Pelvic relaxation, second-degree uterine descensus, and second and third-degree cystocele.  POSTOPERATIVE DIAGNOSIS:  Pelvic relaxation, second-degree uterine descensus, and second and third-degree cystocele.  PROCEDURE PERFORMED: 1. Total vaginal hysterectomy. 2. Bilateral salpingo-oophorectomy. 3. Anterior colporrhaphy.  SURGEON:  Jennell Cornerhomas Schermerhorn, MD  ANESTHESIA:  General endotracheal anesthesia.  FIRST ASSISTANT:  Dalbert GarnetBeasley.  INDICATIONS:  A 56 year old gravida 2, para 2 patient with second-degree uterine descensus and third-degree cystocele.  The patient is symptomatic and wishes to have surgical repair.  DESCRIPTION OF PROCEDURE:  After adequate general endotracheal anesthesia, the patient was placed in dorsal supine position.  Legs were placed in the candy-cane stirrups.  Lower abdomen, perineum, and vagina were prepped and draped in normal sterile fashion.  The patient did receive 2 g IV Ancef prior to commencement of the case.  Time-out was performed.  Straight catheterization of the bladder yielded 200 mL of clear urine.  A weighted speculum was then placed into the vagina and the cervix was grasped with 2 thyroid tenacula.  Cervix was circumferentially injected with 1% lidocaine with 1:100,000 epinephrine. A direct posterior colpotomy incision was performed, and once gaining access into the posterior cul-de-sac, uterosacral ligaments were bilaterally clamped, transected, suture ligated with 0 Vicryl suture. The anterior cervix was then incised with the Bovie.  Cardinal ligaments were then bilaterally clamped, transected, suture ligated  with 0 Vicryl suture.  The anterior cul-de-sac was entered sharply and a Deaver retractor was placed within to elevate the bladder anteriorly.  Uterine arteries were then bilaterally clamped, transected, suture ligated with 0 Vicryl suture.  Sequential bites in the broad ligament followed ultimately the cornua were bilaterally clamped, transected, and sutured with 0 Vicryl suture and tagged for later identification.  The right fallopian tube and ovary were clamped and were transected.  Pedicle was doubly ligated with 0 Vicryl suture.  Similar procedure was repeated on the patient's left side.  Again, the fallopian tube and ovary were clamped, transected, and doubly ligated with 0 Vicryl suture.  Good hemostasis was noted.  The peritoneum was then closed with a pursestring 2-0 PDS suture.  Attention was then directed to the anterior vagina where the cystocele was.  The anterior vagina was placed on traction centrally and injected with 1% lidocaine with epinephrine.  Vaginal epithelium was opened centrally with Metzenbaum scissors.  The vaginal epithelium was dissected free from the bladder with sharp and blunt dissection.  Dissection was performed through approximately 1.5 cm from the urethral meatus.  Healthy endopelvic fascia was identified and defect was closed with 2-0 Vicryl suture placed in horizontal mattress sutures with a good reduction of the cystocele.  Vaginal epithelium was then trimmed and the vaginal cuff was then closed in its entirety with 0 Vicryl suture.  Uterosacral ligaments were plicated centrally, and the rest of vaginal  cuff was closed.  Small amount of oozing was noted from the cystocele site, therefore the vagina was packed with a Kerlix roll with Premarin cream.  Foley was placed yielding additional 100 mL of urine.  There were no complications.  ESTIMATED BLOOD LOSS:  100 mL.  INTRAOPERATIVE FLUIDS:  1500 mL.  URINE OUTPUT:  300 mL.  The patient was taken  to recovery room in good condition.    ______________________________ Jennell Cornerhomas Schermerhorn, MD   ______________________________ Jennell Cornerhomas Schermerhorn, MD    TS/MEDQ  D:  12/01/2016  T:  12/01/2016  Job:  [id number]

## 2017-12-25 ENCOUNTER — Other Ambulatory Visit: Payer: Self-pay | Admitting: Obstetrics and Gynecology

## 2017-12-25 DIAGNOSIS — Z1231 Encounter for screening mammogram for malignant neoplasm of breast: Secondary | ICD-10-CM

## 2018-01-10 ENCOUNTER — Ambulatory Visit
Admission: RE | Admit: 2018-01-10 | Discharge: 2018-01-10 | Disposition: A | Discharge status: Home / Self Care | Payer: BC Managed Care – PPO | Source: Ambulatory Visit | Attending: Obstetrics and Gynecology | Admitting: Obstetrics and Gynecology

## 2018-01-10 DIAGNOSIS — Z1231 Encounter for screening mammogram for malignant neoplasm of breast: Secondary | ICD-10-CM | POA: Diagnosis present

## 2019-01-23 ENCOUNTER — Other Ambulatory Visit: Payer: Self-pay | Admitting: Obstetrics and Gynecology

## 2019-01-23 DIAGNOSIS — Z1231 Encounter for screening mammogram for malignant neoplasm of breast: Secondary | ICD-10-CM

## 2019-02-03 ENCOUNTER — Ambulatory Visit
Admission: RE | Admit: 2019-02-03 | Discharge: 2019-02-03 | Disposition: A | Discharge status: Home / Self Care | Payer: BC Managed Care – PPO | Source: Ambulatory Visit | Attending: Obstetrics and Gynecology | Admitting: Obstetrics and Gynecology

## 2019-02-03 DIAGNOSIS — Z1231 Encounter for screening mammogram for malignant neoplasm of breast: Secondary | ICD-10-CM | POA: Diagnosis not present

## 2019-04-10 ENCOUNTER — Ambulatory Visit: Payer: BC Managed Care – PPO | Attending: Internal Medicine

## 2019-04-10 DIAGNOSIS — Z23 Encounter for immunization: Secondary | ICD-10-CM

## 2019-04-10 NOTE — Progress Notes (Signed)
   Covid-19 Vaccination Clinic  Name:  NAKAIYA BEDDOW    MRN: [id number] DOB: [birth year]  04/10/2019  Ms. Newland was observed post Covid-19 immunization for 15 minutes without incident. She was provided with Vaccine Information Sheet and instruction to access the V-Safe system.   Ms. Vidaurri was instructed to call 911 with any severe reactions post vaccine: Marland Kitchen Difficulty breathing  . Swelling of face and throat  . A fast heartbeat  . A bad rash all over body  . Dizziness and weakness   Immunizations Administered    Name Date Dose VIS Date Route   Pfizer COVID-19 Vaccine 04/10/2019  9:30 AM 0.3 mL 12/27/2018 Intramuscular   Manufacturer: [employer], [employer]   Lot: [id number]   NDC: [address]-9

## 2020-03-16 ENCOUNTER — Other Ambulatory Visit: Payer: Self-pay | Admitting: Obstetrics and Gynecology

## 2020-03-16 DIAGNOSIS — Z1231 Encounter for screening mammogram for malignant neoplasm of breast: Secondary | ICD-10-CM

## 2020-04-01 ENCOUNTER — Other Ambulatory Visit: Payer: Self-pay

## 2020-04-01 ENCOUNTER — Ambulatory Visit
Admission: RE | Admit: 2020-04-01 | Discharge: 2020-04-01 | Disposition: A | Discharge status: Home / Self Care | Payer: BC Managed Care – PPO | Source: Ambulatory Visit | Attending: Obstetrics and Gynecology | Admitting: Obstetrics and Gynecology

## 2020-04-01 DIAGNOSIS — Z1231 Encounter for screening mammogram for malignant neoplasm of breast: Secondary | ICD-10-CM | POA: Diagnosis not present

## 2021-06-26 ENCOUNTER — Emergency Department
Admission: EM | Admit: 2021-06-26 | Discharge: 2021-06-26 | Disposition: A | Discharge status: Home / Self Care | Payer: BC Managed Care – PPO | Attending: Emergency Medicine | Admitting: Emergency Medicine

## 2021-06-26 ENCOUNTER — Other Ambulatory Visit: Payer: Self-pay

## 2021-06-26 ENCOUNTER — Encounter: Payer: Self-pay | Admitting: Emergency Medicine

## 2021-06-26 DIAGNOSIS — H6121 Impacted cerumen, right ear: Secondary | ICD-10-CM | POA: Insufficient documentation

## 2021-06-26 DIAGNOSIS — H9201 Otalgia, right ear: Secondary | ICD-10-CM | POA: Diagnosis present

## 2021-06-26 MED ORDER — CARBAMIDE PEROXIDE 6.5 % OT SOLN
5.0000 [drp] | Freq: Once | OTIC | Status: AC
  Administered 2021-06-26: 5 [drp] via OTIC
  Filled 2021-06-26: qty 15

## 2021-06-26 MED ORDER — NEOMYCIN-POLYMYXIN-HC 3.5-10000-1 OT SOLN: 3.0000 [drp] | Freq: Three times a day (TID) | OTIC | 0 refills | Status: AC

## 2021-06-26 NOTE — ED Triage Notes (Signed)
Pt reports pain to her right ear this am. Pt reports feels like it may be clogged and sometimes can hear movement and wonders if it is fluid or if a tick or something got it in. Pt reports she was out in the woods yesterday.

## 2021-06-26 NOTE — Discharge Instructions (Signed)
Follow-up with your primary care provider if any continued problems.  The Debrox ear solution that was given to you can be used to soften earwax and do not use Q-tips to your ears.  A prescription for Cortisporin otic suspension was sent to the pharmacy if you need it for discomfort in your ear due to the wax impaction you have some irritation in the canal.

## 2021-06-26 NOTE — ED Provider Notes (Signed)
[facility] Provider Note    Event Date/Time   First MD Initiated Contact with Patient 06/26/21 1024     (approximate)   History   Otalgia   HPI  Teresa Mueller is a 61 y.o. female presents to the ED with complaint of right ear pain.  Patient states that hearing has decreased.  She denies any fever chills or upper respiratory symptoms     Physical Exam   Triage Vital Signs: ED Triage Vitals  Enc Vitals Group     BP 06/26/21 1023 122/79     Pulse Rate 06/26/21 1023 80     Resp 06/26/21 1023 18     Temp 06/26/21 1023 98.2 F (36.8 C)     Temp Source 06/26/21 1023 Oral     SpO2 06/26/21 1023 96 %     Weight 06/26/21 1001 158 lb 11.7 oz (72 kg)     Height 06/26/21 1001 5\' 5"  (1.651 m)     Head Circumference --      Peak Flow --      Pain Score 06/26/21 1001 3     Pain Loc --      Pain Edu? --      Excl. in GC? --     Most recent vital signs: Vitals:   06/26/21 1023  BP: 122/79  Pulse: 80  Resp: 18  Temp: 98.2 F (36.8 C)  SpO2: 96%     General: Awake, no distress.  CV:  Good peripheral perfusion.  Resp:  Normal effort.  Abd:  No distention.  Other:  Left EAC is clear and TM is dull.  Right EAC is obstructed with cerumen.   ED Results / Procedures / Treatments   Labs (all labs ordered are listed, but only abnormal results are displayed) Labs Reviewed - No data to display      PROCEDURES:  Critical Care performed:   Procedures   MEDICATIONS ORDERED IN ED: Medications  carbamide peroxide (DEBROX) 6.5 % OTIC (EAR) solution 5 drop (5 drops Right EAR Given by Other 06/26/21 1132)     IMPRESSION / MDM / ASSESSMENT AND PLAN / ED COURSE  I reviewed the triage vital signs and the nursing notes.   Differential diagnosis includes, but is not limited to, otitis media, otitis externa, cerumen impaction, foreign body right EAC.  61 year old female presents with right ear pain and decreased hearing.  Exam shows a cerumen  impaction and Debrox was applied.  Impaction was removed with lavage and patient was feeling much better prior to discharge.  Patient is to follow-up with her PCP if any continued problems or concerns.  A prescription for Cortisporin otic was sent to the pharmacy for patient to use as needed as she does have irritation in the canal from the cerumen impaction.      Patient's presentation is most consistent with acute, uncomplicated illness.  FINAL CLINICAL IMPRESSION(S) / ED DIAGNOSES   Final diagnoses:  Impacted cerumen of right ear     Rx / DC Orders   ED Discharge Orders          Ordered    neomycin-polymyxin-hydrocortisone (CORTISPORIN) OTIC solution  3 times daily        06/26/21 1316             Note:  This document was prepared using Dragon voice recognition software and may include unintentional dictation errors.   08/26/21, PA-C 06/26/21 1409    08/26/21,  MD 06/26/21 1547

## 2021-06-26 NOTE — ED Notes (Signed)
See triage note  presents with pain to right ear   states she developed pain to right ear this am after getting out of the shower.states she recently had shingles to right side of forehead  no rash noted at this time  but she is having increased pain to right frontal area and ear

## 2021-08-16 ENCOUNTER — Other Ambulatory Visit: Payer: Self-pay | Admitting: Neurology

## 2021-08-16 DIAGNOSIS — R519 Headache, unspecified: Secondary | ICD-10-CM

## 2021-08-16 DIAGNOSIS — R200 Anesthesia of skin: Secondary | ICD-10-CM

## 2021-08-25 ENCOUNTER — Ambulatory Visit
Admission: RE | Admit: 2021-08-25 | Discharge: 2021-08-25 | Disposition: A | Discharge status: Home / Self Care | Payer: BC Managed Care – PPO | Source: Ambulatory Visit | Attending: Neurology | Admitting: Neurology

## 2021-08-25 DIAGNOSIS — R519 Headache, unspecified: Secondary | ICD-10-CM | POA: Diagnosis present

## 2021-08-25 DIAGNOSIS — R202 Paresthesia of skin: Secondary | ICD-10-CM | POA: Diagnosis present

## 2021-08-25 DIAGNOSIS — R200 Anesthesia of skin: Secondary | ICD-10-CM | POA: Diagnosis present

## 2021-08-25 MED ORDER — GADOBUTROL 1 MMOL/ML IV SOLN
6.0000 mL | Freq: Once | INTRAVENOUS | Status: AC | PRN
Start: 2021-08-25 — End: 2021-08-25
  Administered 2021-08-25: 6 mL via INTRAVENOUS

## 2021-10-10 ENCOUNTER — Other Ambulatory Visit: Payer: Self-pay | Admitting: Infectious Diseases

## 2021-10-10 DIAGNOSIS — Z1231 Encounter for screening mammogram for malignant neoplasm of breast: Secondary | ICD-10-CM

## 2021-11-04 ENCOUNTER — Ambulatory Visit
Admission: RE | Admit: 2021-11-04 | Discharge: 2021-11-04 | Disposition: A | Discharge status: Home / Self Care | Payer: BC Managed Care – PPO | Source: Ambulatory Visit | Attending: Infectious Diseases | Admitting: Infectious Diseases

## 2021-11-04 DIAGNOSIS — Z1231 Encounter for screening mammogram for malignant neoplasm of breast: Secondary | ICD-10-CM

## 2022-10-10 ENCOUNTER — Other Ambulatory Visit: Payer: Self-pay | Admitting: Infectious Diseases

## 2022-10-10 DIAGNOSIS — Z1231 Encounter for screening mammogram for malignant neoplasm of breast: Secondary | ICD-10-CM

## 2022-11-20 ENCOUNTER — Ambulatory Visit
Admission: RE | Admit: 2022-11-20 | Discharge: 2022-11-20 | Disposition: A | Discharge status: Home / Self Care | Payer: BC Managed Care – PPO | Source: Ambulatory Visit | Attending: Infectious Diseases | Admitting: Infectious Diseases

## 2022-11-20 DIAGNOSIS — Z1231 Encounter for screening mammogram for malignant neoplasm of breast: Secondary | ICD-10-CM | POA: Diagnosis present

## 2023-06-12 ENCOUNTER — Other Ambulatory Visit: Payer: Self-pay | Admitting: Neurology

## 2023-06-12 DIAGNOSIS — R413 Other amnesia: Secondary | ICD-10-CM

## 2023-06-18 ENCOUNTER — Ambulatory Visit

## 2023-11-08 ENCOUNTER — Other Ambulatory Visit: Payer: Self-pay | Admitting: Infectious Diseases

## 2023-11-08 DIAGNOSIS — Z1231 Encounter for screening mammogram for malignant neoplasm of breast: Secondary | ICD-10-CM

## 2023-12-11 ENCOUNTER — Ambulatory Visit
Admission: RE | Admit: 2023-12-11 | Discharge: 2023-12-11 | Disposition: A | Discharge status: Home / Self Care | Source: Ambulatory Visit | Attending: Infectious Diseases | Admitting: Infectious Diseases

## 2023-12-11 DIAGNOSIS — Z1231 Encounter for screening mammogram for malignant neoplasm of breast: Secondary | ICD-10-CM | POA: Insufficient documentation
